# Patient Record
Sex: Male | Born: 1985
Health system: Southern US, Community
[De-identification: ages and names within clinical notes are randomized; demographics above are authoritative.]

## PROBLEM LIST (undated history)

## (undated) DIAGNOSIS — Z952 Presence of prosthetic heart valve: Secondary | ICD-10-CM

## (undated) DIAGNOSIS — I2510 Atherosclerotic heart disease of native coronary artery without angina pectoris: Secondary | ICD-10-CM

## (undated) DIAGNOSIS — I10 Essential (primary) hypertension: Secondary | ICD-10-CM

## (undated) HISTORY — PX: CARDIAC SURGERY: SHX584

## (undated) HISTORY — PX: MITRAL VALVE REPLACEMENT: SHX147

---

## 2014-09-24 DIAGNOSIS — Z952 Presence of prosthetic heart valve: Secondary | ICD-10-CM

## 2014-09-24 HISTORY — DX: Presence of prosthetic heart valve: Z95.2

## 2016-01-29 ENCOUNTER — Encounter (HOSPITAL_COMMUNITY): Payer: Self-pay | Admitting: Emergency Medicine

## 2016-01-29 ENCOUNTER — Emergency Department (HOSPITAL_COMMUNITY)
Admission: EM | Admit: 2016-01-29 | Discharge: 2016-01-29 | Disposition: A | Discharge status: Home / Self Care | Payer: Commercial Managed Care - PPO | Attending: Emergency Medicine | Admitting: Emergency Medicine

## 2016-01-29 ENCOUNTER — Emergency Department (HOSPITAL_COMMUNITY): Payer: Commercial Managed Care - PPO

## 2016-01-29 DIAGNOSIS — I10 Essential (primary) hypertension: Secondary | ICD-10-CM | POA: Diagnosis not present

## 2016-01-29 DIAGNOSIS — Z7901 Long term (current) use of anticoagulants: Secondary | ICD-10-CM | POA: Diagnosis not present

## 2016-01-29 DIAGNOSIS — Z7952 Long term (current) use of systemic steroids: Secondary | ICD-10-CM | POA: Insufficient documentation

## 2016-01-29 DIAGNOSIS — G510 Bell's palsy: Secondary | ICD-10-CM

## 2016-01-29 DIAGNOSIS — R29810 Facial weakness: Secondary | ICD-10-CM | POA: Insufficient documentation

## 2016-01-29 HISTORY — DX: Essential (primary) hypertension: I10

## 2016-01-29 LAB — PROTIME-INR
INR: 1.09 (ref 0.00–1.49)
PROTHROMBIN TIME: 13.9 s (ref 11.6–15.2)

## 2016-01-29 MED ORDER — PREDNISONE 10 MG PO TABS: 60.0000 mg | ORAL_TABLET | Freq: Every day | ORAL | Status: DC

## 2016-01-29 NOTE — ED Provider Notes (Signed)
CSN: [id number]     Arrival date & time 01/29/16  0755 History   First MD Initiated Contact with Patient 01/29/16 0802     Chief Complaint  Patient presents with  . Facial Droop     (Consider location/radiation/quality/duration/timing/severity/associated sxs/prior Treatment) HPI 30 year old male who presents with left-sided facial droop. He has a history of a mitral valve replacement on Coumadin. States that he is not very compliant with his Coumadin. 2 days ago noticed that he had a left-sided facial droop. Thinks it may have been associated with heaviness in his left arm, and sensation that he may have had some difficulty using the arm. Noticed blurry vision with this. Denies any double vision, difficulty swallowing, dysarthria or aphasia, difficulty ambulating. States that over the past 2 days his symptoms had asked gradually resolved. Was told by a family member to come to the ED given that he has not been compliant with his Coumadin. Currently states that his symptoms have significantly improved, and he states he thinks he may feel slight droop. No fever, chills, cough, URI symptoms, cold sores.   Past Medical History  Diagnosis Date  . Hypertension    Past Surgical History  Procedure Laterality Date  . Mitral valve replacement    . Cardiac surgery     History reviewed. No pertinent family history. Social History  Substance Use Topics  . Smoking status: None  . Smokeless tobacco: None  . Alcohol Use: None    Review of Systems 10/14 systems reviewed and are negative other than those stated in the HPI    Allergies  Review of patient's allergies indicates no known allergies.  Home Medications   Prior to Admission medications   Medication Sig Start Date End Date Taking? Authorizing Provider  warfarin (COUMADIN) 4 MG tablet Take 12 mg by mouth daily.   Yes Historical Provider, MD  predniSONE (DELTASONE) 10 MG tablet Take 6 tablets (60 mg total) by mouth daily. 01/29/16   Lavera Guise, MD   BP 135/94 mmHg  Pulse 77  Temp(Src) 98.7 F (37.1 C) (Oral)  Resp 18  Wt 220 lb (99.791 kg)  SpO2 98% Physical Exam Physical Exam  Nursing note and vitals reviewed. Constitutional: Well developed, well nourished, non-toxic, and in no acute distress Head: Normocephalic and atraumatic.  Mouth/Throat: Oropharynx is clear and moist.  Neck: Normal range of motion. Neck supple.  Cardiovascular: Normal rate and regular rhythm.   Pulmonary/Chest: Effort normal and breath sounds normal.  Abdominal: Soft. There is no tenderness. There is no rebound and no guarding.  Musculoskeletal: Normal range of motion.  Skin: Skin is warm and dry.  Psychiatric: Cooperative Neurological:  Alert, oriented to person, place, time, and situation. Memory grossly in tact. Fluent speech. No dysarthria or aphasia.  Cranial nerves: VF are full. Pupils are symmetric, and reactive to light. EOMI without nystagmus. No gaze deviation. Facial muscles symmetric with activation, but at rest with lack of forehead wrinkles on the left compared to right side. Sensation to light touch over face in tact bilaterally. Hearing grossly in tact. Palate elevates symmetrically. Head turn and shoulder shrug are intact. Tongue midline.  Reflexes defered.  Muscle bulk and tone normal. No pronator drift. Moves all extremities symmetrically. Sensation to light touch is in tact throughout in bilateral upper and lower extremities. Coordination reveals no dysmetria with finger to nose. Gait is narrow-based and steady. Non-ataxic.   ED Course  Procedures (including critical care time) Labs Review Labs Reviewed  [facility]  Imaging Review Mr Sherrin DaisyBrain Wo Contrast  01/29/2016  CLINICAL DATA:  30 year old hypertensive male non compliant with Coumadin (post mitral valve replacement) presenting with history of left-sided facial droop and questionable left arm heaviness 2 days ago which has resolved. Possible Bell's palsy.  Initial encounter. EXAM: MRI HEAD WITHOUT CONTRAST TECHNIQUE: Multiplanar, multiecho pulse sequences of the brain and surrounding structures were obtained without intravenous contrast. COMPARISON:  None. FINDINGS: No acute infarct or intracranial hemorrhage. No intracranial mass lesion noted on this unenhanced exam. No hydrocephalus. Right vertebral artery poorly delineated. Ectatic left vertebral artery and basilar artery. Limited ability to evaluate for Bell's palsy without dedicated contrast enhanced thin-section imaging through the internal auditory canal region. No gross abnormality detected. Partial opacification ethmoid sinus air cells with minimal mucosal thickening maxillary sinuses. Slight decreased signal intensity of bone marrow of portions of the clivus without discrete mass noted. Cervical medullary junction and pineal region unremarkable. IMPRESSION: No acute infarct or intracranial hemorrhage. No intracranial mass lesion noted on this unenhanced exam. Right vertebral artery poorly delineated. Limited ability to evaluate for Bell's palsy without dedicated contrast enhanced thin-section imaging through the internal auditory canal region. No gross abnormality detected. Partial opacification ethmoid sinus air cells with minimal mucosal thickening maxillary sinuses. Electronically Signed   By: Lacy DuverneySteven  Olson M.D.   On: 01/29/2016 11:07   I have personally reviewed and evaluated these images and lab results as part of my medical decision-making.   EKG Interpretation None      MDM   Final diagnoses:  Bell's palsy    30 year old male with history of mitral valve replacement and noncompliance with Coumadin who presents with left-sided facial droop and questionable left arm heaviness 2 days ago, which is now resolved. On presentation he is nontoxic and in no acute distress. Mildly hypertensive with blood pressure 140s over 100. Has normal activation of facial muscles, but I do notice a subtle  lack of wrinkles over the left forehead compared to the right. My suspicion is that he may have had a Bell's palsy, but question of left arm heaviness which is now resolved would argue against that. Also risk for embolic stroke given his noncompliance with Coumadin. We'll obtain MRI and INR. If MRI is negative likely Bell's palsy.  MRI visualized. Reviewed with radiology. No evidence of acute infarct or hemorrhage. Given a course of steroids for Bell's pulse and discussed eye care the patient has full eye closing now and only subtle findings of lack of wrinkles over the left forehead. Strict return and follow-up instructions are reviewed. Discussed the importance of compliance with Coumadin. He is breast understanding of all discharge instructions, and felt comfortable with the plan of care.   Lavera Guiseana Duo Maud Rubendall, MD 01/29/16 [phone]

## 2016-01-29 NOTE — ED Notes (Signed)
MD at bedside. 

## 2016-01-29 NOTE — ED Notes (Addendum)
Hx of mitral valve replacement last year. Has not been taking his blood thinner regularly over the last couple months. Noticed right sided facial droop on Friday, worse Saturday. States it's gotten better since. No other neuro deficits observed in triage.

## 2016-01-29 NOTE — Discharge Instructions (Signed)
Your MRI is negative for stroke. You likely have Bell's palsy. Take steroids as prescribed. This gets better slowly over several days to weeks. Return for worsening symptoms including slurred speech or word finding difficulties, numbness or weakness of the arms or legs, difficulty walking, vision changes, or any other symptoms concerning to you.  Bell Palsy Bell palsy is a condition in which the muscles on one side of the face become paralyzed. This often causes one side of the face to droop. It is a common condition and most people recover completely. RISK FACTORS Risk factors for Bell palsy include:  Pregnancy.  Diabetes.  An infection by a virus, such as infections that cause cold sores. CAUSES  Bell palsy is caused by damage to or inflammation of a nerve in your face. It is unclear why this happens, but an infection by a virus may lead to it. Most of the time the reason it happens is unknown. SIGNS AND SYMPTOMS  Symptoms can range from mild to severe and can take place over a number of hours. Symptoms may include:  Being unable to:  Raise one or both eyebrows.  Close one or both eyes.  Feel parts of your face (facial numbness).  Drooping of the eyelid and corner of the mouth.  Weakness in the face.  Paralysis of half your face.  Loss of taste.  Sensitivity to loud noises.  Difficulty chewing.  Tearing up of the affected eye.  Dryness in the affected eye.  Drooling.  Pain behind one ear. DIAGNOSIS  Diagnosis of Bell palsy may include:  A medical history and physical exam.  An MRI.  A CT scan.  Electromyography (EMG). This is a test that checks how your nerves are working. TREATMENT  Treatment may include antiviral medicine to help shorten the length of the condition. Sometimes treatment is not needed and the symptoms go away on their own. HOME CARE INSTRUCTIONS   Take medicines only as directed by your health care provider.  Do facial massages and  exercises as directed by your health care provider.  If your eye is affected:  Use moisturizing eye drops to prevent drying of your eye as directed by your health care provider.  Protect your eye as directed by your health care provider. SEEK MEDICAL CARE IF:  Your symptoms do not get better or get worse.  You are drooling.  Your eye is red, irritated, or hurts. SEEK IMMEDIATE MEDICAL CARE IF:   Another part of your body feels weak or numb.  You have difficulty swallowing.  You have a fever along with symptoms of Bell palsy.  You develop neck pain. MAKE SURE YOU:   Understand these instructions.  Will watch your condition.  Will get help right away if you are not doing well or get worse.   This information is not intended to replace advice given to you by your health care provider. Make sure you discuss any questions you have with your health care provider.   Document Released: 09/10/2005 Document Revised: 06/01/2015 Document Reviewed: 12/18/2013 Elsevier Interactive Patient Education [employer]2016 Elsevier Inc.

## 2016-01-29 NOTE — ED Notes (Signed)
Discharge instructions, follow up care, and rx x1 reviewed with patient. Patient verbalized understanding. 

## 2018-04-25 MED FILL — WARFARIN SODIUM 4 MG TABLET: 4 | 30 days supply | Qty: 90 | Fill #0

## 2018-08-05 ENCOUNTER — Other Ambulatory Visit: Payer: Self-pay

## 2018-08-05 ENCOUNTER — Emergency Department (HOSPITAL_COMMUNITY): Payer: 59

## 2018-08-05 ENCOUNTER — Inpatient Hospital Stay (HOSPITAL_COMMUNITY)
Admission: EM | Admit: 2018-08-05 | Discharge: 2018-08-07 | DRG: 251 | Disposition: A | Discharge status: Home / Self Care | Payer: 59 | Attending: Cardiology | Admitting: Cardiology

## 2018-08-05 ENCOUNTER — Encounter (HOSPITAL_COMMUNITY): Payer: Self-pay

## 2018-08-05 DIAGNOSIS — I4510 Unspecified right bundle-branch block: Secondary | ICD-10-CM | POA: Diagnosis present

## 2018-08-05 DIAGNOSIS — I25110 Atherosclerotic heart disease of native coronary artery with unstable angina pectoris: Secondary | ICD-10-CM | POA: Diagnosis present

## 2018-08-05 DIAGNOSIS — R7989 Other specified abnormal findings of blood chemistry: Secondary | ICD-10-CM | POA: Diagnosis not present

## 2018-08-05 DIAGNOSIS — Z7901 Long term (current) use of anticoagulants: Secondary | ICD-10-CM

## 2018-08-05 DIAGNOSIS — I10 Essential (primary) hypertension: Secondary | ICD-10-CM | POA: Diagnosis not present

## 2018-08-05 DIAGNOSIS — Z833 Family history of diabetes mellitus: Secondary | ICD-10-CM | POA: Diagnosis not present

## 2018-08-05 DIAGNOSIS — Z9119 Patient's noncompliance with other medical treatment and regimen: Secondary | ICD-10-CM

## 2018-08-05 DIAGNOSIS — Z952 Presence of prosthetic heart valve: Secondary | ICD-10-CM

## 2018-08-05 DIAGNOSIS — I200 Unstable angina: Secondary | ICD-10-CM | POA: Diagnosis present

## 2018-08-05 DIAGNOSIS — I214 Non-ST elevation (NSTEMI) myocardial infarction: Principal | ICD-10-CM

## 2018-08-05 DIAGNOSIS — I2510 Atherosclerotic heart disease of native coronary artery without angina pectoris: Secondary | ICD-10-CM | POA: Diagnosis not present

## 2018-08-05 DIAGNOSIS — Z955 Presence of coronary angioplasty implant and graft: Secondary | ICD-10-CM

## 2018-08-05 DIAGNOSIS — R079 Chest pain, unspecified: Secondary | ICD-10-CM | POA: Diagnosis not present

## 2018-08-05 HISTORY — DX: Atherosclerotic heart disease of native coronary artery without angina pectoris: I25.10

## 2018-08-05 HISTORY — DX: Presence of prosthetic heart valve: Z95.2

## 2018-08-05 LAB — HEPARIN LEVEL (UNFRACTIONATED): Heparin Unfractionated: 0.16 IU/mL — ABNORMAL LOW (ref 0.30–0.70)

## 2018-08-05 LAB — BASIC METABOLIC PANEL
Anion gap: 7 (ref 5–15)
BUN: 11 mg/dL (ref 6–20)
CO2: 28 mmol/L (ref 22–32)
CREATININE: 1.04 mg/dL (ref 0.61–1.24)
Calcium: 9.2 mg/dL (ref 8.9–10.3)
Chloride: 104 mmol/L (ref 98–111)
GFR calc non Af Amer: >60 mL/min (ref >60)
Glucose, Bld: 97 mg/dL (ref 70–99)
Potassium: 4.1 mmol/L (ref 3.5–5.1)
Sodium: 139 mmol/L (ref 135–145)

## 2018-08-05 LAB — TROPONIN I
TROPONIN I: 13.49 ng/mL — AB (ref <0.03)
TROPONIN I: 15.91 ng/mL — AB (ref <0.03)
Troponin I: 0.09 ng/mL (ref <0.03)

## 2018-08-05 LAB — CBC WITH DIFFERENTIAL/PLATELET
Abs Immature Granulocytes: 0.02 10*3/uL (ref 0.00–0.07)
BASOS ABS: 0 10*3/uL (ref 0.0–0.1)
Basophils Relative: 0 %
EOS PCT: 0 %
Eosinophils Absolute: 0 10*3/uL (ref 0.0–0.5)
HCT: 46 % (ref 39.0–52.0)
Hemoglobin: 15.5 g/dL (ref 13.0–17.0)
IMMATURE GRANULOCYTES: 0 %
LYMPHS ABS: 1.4 10*3/uL (ref 0.7–4.0)
LYMPHS PCT: 32 %
MCH: 29.3 pg (ref 26.0–34.0)
MCHC: 33.7 g/dL (ref 30.0–36.0)
MCV: 87 fL (ref 80.0–100.0)
Monocytes Absolute: 0.5 10*3/uL (ref 0.1–1.0)
Monocytes Relative: 10 %
NEUTROS ABS: 2.6 10*3/uL (ref 1.7–7.7)
NEUTROS PCT: 58 %
NRBC: 0 % (ref 0.0–0.2)
PLATELETS: 278 10*3/uL (ref 150–400)
RBC: 5.29 MIL/uL (ref 4.22–5.81)
RDW: 12.8 % (ref 11.5–15.5)
WBC: 4.6 10*3/uL (ref 4.0–10.5)

## 2018-08-05 LAB — PROTIME-INR
INR: 1.09
Prothrombin Time: 14 seconds (ref 11.4–15.2)

## 2018-08-05 LAB — I-STAT TROPONIN, ED: TROPONIN I, POC: 0.07 ng/mL (ref 0.00–0.08)

## 2018-08-05 LAB — APTT: aPTT: 33 seconds (ref 24–36)

## 2018-08-05 MED ORDER — WARFARIN - PHARMACIST DOSING INPATIENT: Freq: Every day | Status: DC

## 2018-08-05 MED ORDER — WARFARIN SODIUM 10 MG PO TABS
10.0000 mg | ORAL_TABLET | Freq: Once | ORAL | Status: DC
  Filled 2018-08-05 (×2): qty 1

## 2018-08-05 MED ORDER — METOPROLOL TARTRATE 25 MG PO TABS
25.0000 mg | ORAL_TABLET | Freq: Two times a day (BID) | ORAL | Status: DC
  Administered 2018-08-05 – 2018-08-07 (×4): 25 mg via ORAL
  Filled 2018-08-05 (×4): qty 1

## 2018-08-05 MED ORDER — HEPARIN (PORCINE) 25000 UT/250ML-% IV SOLN
1600.0000 [IU]/h | INTRAVENOUS | Status: DC
  Administered 2018-08-05: 1300 [IU]/h via INTRAVENOUS
  Administered 2018-08-06: 1600 [IU]/h via INTRAVENOUS
  Filled 2018-08-05 (×2): qty 250

## 2018-08-05 MED ORDER — ASPIRIN EC 81 MG PO TBEC
81.0000 mg | DELAYED_RELEASE_TABLET | Freq: Every day | ORAL | Status: DC
  Administered 2018-08-07: 08:00:00 81 mg via ORAL
  Filled 2018-08-05: qty 1

## 2018-08-05 MED ORDER — ACETAMINOPHEN 325 MG PO TABS: 650.0000 mg | ORAL_TABLET | ORAL | Status: DC | PRN

## 2018-08-05 MED ORDER — ONDANSETRON HCL 4 MG/2ML IJ SOLN: 4.0000 mg | Freq: Four times a day (QID) | INTRAMUSCULAR | Status: DC | PRN

## 2018-08-05 MED ORDER — KETOROLAC TROMETHAMINE 30 MG/ML IJ SOLN
30.0000 mg | Freq: Once | INTRAMUSCULAR | Status: AC
  Administered 2018-08-05: 30 mg via INTRAVENOUS
  Filled 2018-08-05: qty 1

## 2018-08-05 MED ORDER — ATORVASTATIN CALCIUM 40 MG PO TABS
40.0000 mg | ORAL_TABLET | Freq: Every day | ORAL | Status: DC
  Administered 2018-08-05 – 2018-08-06 (×2): 40 mg via ORAL
  Filled 2018-08-05 (×2): qty 1

## 2018-08-05 MED ORDER — NITROGLYCERIN 0.4 MG SL SUBL: 0.4000 mg | SUBLINGUAL_TABLET | SUBLINGUAL | Status: DC | PRN

## 2018-08-05 MED ORDER — ASPIRIN 81 MG PO CHEW
324.0000 mg | CHEWABLE_TABLET | Freq: Once | ORAL | Status: AC
  Administered 2018-08-05: 324 mg via ORAL
  Filled 2018-08-05: qty 4

## 2018-08-05 MED ORDER — HEPARIN BOLUS VIA INFUSION
4000.0000 [IU] | Freq: Once | INTRAVENOUS | Status: AC
  Administered 2018-08-05: 4000 [IU] via INTRAVENOUS
  Filled 2018-08-05: qty 4000

## 2018-08-05 MED ORDER — METOPROLOL TARTRATE 25 MG PO TABS
50.0000 mg | ORAL_TABLET | Freq: Once | ORAL | Status: AC
  Administered 2018-08-05: 50 mg via ORAL
  Filled 2018-08-05: qty 2

## 2018-08-05 NOTE — Progress Notes (Signed)
The second troponin increased from 0.07 -> 13.49, we will cancel coronary CTA and schedule a cardiac catheterization in the am, we will ask an interventional cardiologist to perform fluoroscopy to assess mechanical mitral valve mobility. Discontinue coumadin, continue heparin drip.  Tobias AlexanderKatarina Haruna Rohlfs, MD 08/05/2018

## 2018-08-05 NOTE — H&P (Signed)
Cardiology Admission History and Physical:   Patient ID: Benjamin Rodriguez MRN: [id number]; DOB: 1986-05-13   Admission date: 08/05/2018  Primary Care Provider: Patient, No Pcp Per Primary Cardiologist: in [facility], last seen in 2017  Chief Complaint: Chest pain  Patient Profile:   Benjamin Rodriguez is a 32 y.o. male with mitral valve replacement in 2016 presenting with chest pain for the last few weeks.  History of Present Illness:   Benjamin Rodriguez is a very pleasant 32 year old male with past medical history of severe mitral regurgitation status post replacement with a mechanical Saint Jude valve at [facility] on November 12, 2014.  Per patient prior to the surgery he had symptoms of dyspnea that completely resolved post surgery.  He has been started on Coumadin and to follow for limited time with a cardiologist at [facility].  He has moved to [place] 2 years ago and states that for about the last year he has not been able to get prescription for Coumadin as he did not have PCP nor cardiologist.  Despite that he has been doing well he maintain a healthy weight and exercise daily he did not noticed any lower extremity edema orthopnea proximal nocturnal dyspnea, no dyspnea on exertion while working out.  The patient has noticed that for the last few weeks he has been having intermittent retrosternal chest pains that are not related to exertion, he is still able to exercise in the mornings, his pain feels like a squeeze, they can be sharp, they are worse with deep inspiration no necessarily worse in horizontal position.  He came to the ER as his chest pain would not go away overnight.  It has completely resolved with ketorolac.  His family history is significant for diabetes but otherwise no CHF or premature coronary artery disease.  He drinks occasionally does not smoke and does not use any drugs.  He currently does not use any medications.   Past Medical History:  Diagnosis Date  . Hypertension     Past Surgical  History:  Procedure Laterality Date  . CARDIAC SURGERY    . MITRAL VALVE REPLACEMENT       Medications Prior to Admission: Prior to Admission medications   Medication Sig Start Date End Date Taking? Authorizing Provider  warfarin (COUMADIN) 4 MG tablet Take 12 mg by mouth daily.   Yes [provider]  predniSONE (DELTASONE) 10 MG tablet Take 6 tablets (60 mg total) by mouth daily. Patient not taking: Reported on 08/05/2018 01/29/16   Lavera Guise, MD  warfarin (COUMADIN) 4 MG tablet Take 12 mg by mouth daily.    [provider]     Allergies:   No Known Allergies  Social History:   Social History   Socioeconomic History  . Marital status: Single    Spouse name: Not on file  . Number of children: Not on file  . Years of education: Not on file  . Highest education level: Not on file  Occupational History  . Not on file  Social Needs  . Financial resource strain: Not on file  . Food insecurity:    Worry: Not on file    Inability: Not on file  . Transportation needs:    Medical: Not on file    Non-medical: Not on file  Tobacco Use  . Smoking status: Never Smoker  . Smokeless tobacco: Never Used  Substance and Sexual Activity  . Alcohol use: Not on file  . Drug use: Not on file  . Sexual  activity: Not on file  Lifestyle  . Physical activity:    Days per week: Not on file    Minutes per session: Not on file  . Stress: Not on file  Relationships  . Social connections:    Talks on phone: Not on file    Gets together: Not on file    Attends religious service: Not on file    Active member of club or organization: Not on file    Attends meetings of clubs or organizations: Not on file    Relationship status: Not on file  . Intimate partner violence:    Fear of current or ex partner: Not on file    Emotionally abused: Not on file    Physically abused: Not on file    Forced sexual activity: Not on file  Other Topics Concern  . Not on file  Social  History Narrative  . Not on file    Family History: Diabetes mellitus in his mother.  ROS:  Please see the history of present illness.  All other ROS reviewed and negative.     Physical Exam/Data:   Vitals:   08/05/18 0936 08/05/18 1000 08/05/18 1030 08/05/18 1100  BP: 139/84 (!) 141/93 (!) 147/97 (!) 135/101  Pulse: 78 81 79 82  Resp: 16 14 17 16   Temp:      TempSrc:      SpO2: 99% 97% 93% 97%  Weight:      Height:       No intake or output data in the 24 hours ending 08/05/18 1116 Filed Weights   08/05/18 0752  Weight: 96.2 kg   Body mass index is 30.42 kg/m.  General:  Well nourished, well developed, in no acute distress HEENT: normal Lymph: no adenopathy Neck: no JVD Endocrine:  No thryomegaly Vascular: No carotid bruits; FA pulses 2+ bilaterally without bruits  Cardiac: Prominent S1 and S2 click and only mild systolic 2 out of 6 murmur. Lungs:  clear to auscultation bilaterally, no wheezing, rhonchi or rales  Abd: soft, nontender, no hepatomegaly  Ext: no edema Musculoskeletal:  No deformities, BUE and BLE strength normal and equal Skin: warm and dry  Neuro:  CNs 2-12 intact, no focal abnormalities noted Psych:  Normal affect   EKG:  The ECG that was done  was personally reviewed and demonstrates normal sinus rhythm with early repolarization in inferolateral leads.  No prior EKGs available for comparison.  Relevant CV Studies: Echocardiogram is ordered  Laboratory Data:  Chemistry Recent Labs  Lab 08/05/18 0833  NA 139  K 4.1  CL 104  CO2 28  GLUCOSE 97  BUN 11  CREATININE 1.04  CALCIUM 9.2  GFRNONAA >60  GFRAA >60  ANIONGAP 7    No results for input(s): PROT, ALBUMIN, AST, ALT, ALKPHOS, BILITOT in the last 168 hours. Hematology Recent Labs  Lab 08/05/18 0833  WBC 4.6  RBC 5.29  HGB 15.5  HCT 46.0  MCV 87.0  MCH 29.3  MCHC 33.7  RDW 12.8  PLT 278   Cardiac Enzymes Recent Labs  Lab 08/05/18 0833  TROPONINI 0.09*    Recent  Labs  Lab 08/05/18 0839  TROPIPOC 0.07    BNPNo results for input(s): BNP, PROBNP in the last 168 hours.  DDimer No results for input(s): DDIMER in the last 168 hours.  Radiology/Studies:  Dg Chest 2 View  Result Date: 08/05/2018 CLINICAL DATA:  Chest pain EXAM: CHEST - 2 VIEW COMPARISON:  None. FINDINGS: Prior mitral  valve repair. Mild cardiomegaly. Lungs clear. No effusions or acute bony abnormality. IMPRESSION: Mild cardiomegaly.  No active disease. Electronically Signed   By: Charlett Nose M.D.   On: 08/05/2018 08:48    Assessment and Plan:   1. Chest pain  -With minimal troponin elevation, EKG with only early repolarization, given his age and noncompliance with Coumadin the most worrisome scenario would be mitral valve clotting with embolization to coronary artery, however his symptoms more pleuritic suggestive of acute pericarditis also his symptoms have been going on for few weeks that goes against acute coronary syndrome.  This is most probably acute myopericarditis. -We will start heparin drip until we rule out acute coronary syndrome.  2.  Status post mitral valve replacement with a 31 mm St Jude mechanical valve, the patient has not been on anticoagulation for last year, his INR is 1, will start heparin drip and ask pharmacy to dose Coumadin. We will establish care with our clinic for this patient.  We will obtain an echocardiogram to evaluate for LVEF, to see motion of the mitral valve leaflets and transmitral gradients, the patient does not show any signs of fluid overload on physical exam.  I will also obtain coronary CTA to see if there is any thrombus present on mitral valve leaflets.  We will transfer the patient to [facility] telemetry.  Severity of Illness: The appropriate patient status for this patient is INPATIENT. Inpatient status is judged to be reasonable and necessary in order to provide the required intensity of service to ensure the patient's safety. The  patient's presenting symptoms, physical exam findings, and initial radiographic and laboratory data in the context of their chronic comorbidities is felt to place them at high risk for further clinical deterioration. Furthermore, it is not anticipated that the patient will be medically stable for discharge from the hospital within 2 midnights of admission. The following factors support the patient status of inpatient.   " The patient's presenting symptoms include chest pain. " The worrisome physical exam findings include systolic murmur. " The initial radiographic and laboratory data are worrisome because of elevated troponin, low INR in the settings of mechanical mitral valve. " The chronic co-morbidities include status post mitral valve replacement, noncompliance.   * I certify that at the point of admission it is my clinical judgment that the patient will require inpatient hospital care spanning beyond 2 midnights from the point of admission due to high intensity of service, high risk for further deterioration and high frequency of surveillance required.*    For questions or updates, please contact [facility] Please consult [email or web address] for contact info under        Signed, Tobias Alexander, MD  08/05/2018 11:16 AM

## 2018-08-05 NOTE — ED Triage Notes (Signed)
Pt reports chest tightness x 2 hours ago. Denies pain or radiating. Prior Mitral valve replacement 2016

## 2018-08-05 NOTE — Progress Notes (Signed)
ANTICOAGULATION CONSULT NOTE - Initial Consult  Pharmacy Consult for Heparin/Coumadin Indication: chest pain/ACS/MVR  No Known Allergies  Patient Measurements: Height: 5\' 10"  (177.8 cm) Weight: 212 lb (96.2 kg) IBW/kg (Calculated) : 73 Heparin Dosing Weight: 93 kg  Vital Signs: Temp: 97.9 F (36.6 C) (11/12 0749) Temp Source: Oral (11/12 0749) BP: 135/101 (11/12 1100) Pulse Rate: 82 (11/12 1100)  Labs: Recent Labs    08/05/18 0833  HGB 15.5  HCT 46.0  PLT 278  CREATININE 1.04  TROPONINI 0.09*    Estimated Creatinine Clearance: 117.6 mL/min (by C-G formula based on SCr of 1.04 mg/dL).   Medical History: Past Medical History:  Diagnosis Date  . Hypertension     Medications:  Off Coumadin for ~ 1 year due to no PCP, cardiologist  Assessment: 32 y/o M with MVR with Saint Jude mechanical valve in 2016 who has been off Coumadin due to lack of PCP, cardiologist [place]. Per cardiology, symptoms are more consistent with pericarditis. Heparin drip initiated until r/o ACS.   Goal of Therapy:  INR 2.5-3.5 Heparin level 0.3-0.7 units/ml Monitor platelets by anticoagulation protocol: Yes   Plan:  Give 4000 units bolus x 1 Start heparin infusion at 1300 units/hr Coumadin 10 mg x 1 Check anti-Xa level in 6 hours and daily while on heparin Daily INR Continue to monitor H&H and platelets  Luisa Harthristy, Shadow Stiggers D 08/05/2018,11:36 AM

## 2018-08-05 NOTE — ED Provider Notes (Signed)
[facility]-EMERGENCY DEPT Provider Note   CSN: [id number] Arrival date & time: 08/05/18  4098     History   Chief Complaint Chief Complaint  Patient presents with  . Chest Pain    HPI Benjamin Rodriguez is a 32 y.o. male.  Patient is a 32 year old male who presents with chest pain.  He states that he has had some intermittent discomfort to the center of his chest over the last 2 weeks.  Describes as a tightness.  It usually only lasts about 10 to 20 minutes but he had an episode this morning that lasted about an hour.  He states he only has some mild discomfort currently.  He felt a little short of breath this morning but no other shortness of breath.  The pain is not exertional.  He has no associated nausea or vomiting.  No fevers.  He did recently get over a URI symptoms and has had a residual cough since then.  No history of coronary artery disease that is known.  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  He was previously on Coumadin but when he moved from [facility] to [place], he has not been able to establish care with a new PCP.  Therefore he has been off his Coumadin for about 2 months.  No leg pain or swelling.     Past Medical History:  Diagnosis Date  . Hypertension     Patient Active Problem List   Diagnosis Date Noted  . Unstable angina (HCC) 08/05/2018    Past Surgical History:  Procedure Laterality Date  . CARDIAC SURGERY    . MITRAL VALVE REPLACEMENT          Home Medications    Prior to Admission medications   Medication Sig Start Date End Date Taking? Authorizing Provider  warfarin (COUMADIN) 4 MG tablet Take 12 mg by mouth daily.   Yes [provider]  predniSONE (DELTASONE) 10 MG tablet Take 6 tablets (60 mg total) by mouth daily. Patient not taking: Reported on 08/05/2018 01/29/16   Lavera Guise, MD  warfarin (COUMADIN) 4 MG tablet Take 12 mg by mouth daily.    [provider]    Family History History reviewed. No pertinent family history.  Social History Social History   Tobacco Use  . Smoking status: Never Smoker  . Smokeless tobacco: Never Used  Substance Use Topics  . Alcohol use: Not on file  . Drug use: Not on file     Allergies   Patient has no known allergies.   Review of Systems Review of Systems  Constitutional: Negative for chills, diaphoresis, fatigue and fever.  HENT: Negative for congestion, rhinorrhea and sneezing.   Eyes: Negative.   Respiratory: Positive for shortness of breath. Negative for cough and chest tightness.   Cardiovascular: Positive for chest pain. Negative for leg swelling.  Gastrointestinal: Negative for abdominal pain, blood in stool, diarrhea, nausea and vomiting.  Genitourinary: Negative for difficulty urinating, flank pain, frequency and hematuria.  Musculoskeletal: Negative for arthralgias and back pain.  Skin: Negative for rash.  Neurological: Negative for dizziness, speech difficulty, weakness, numbness and headaches.     Physical Exam Updated Vital Signs BP 138/87   Pulse 77   Temp 97.9 F (36.6 C) (Oral)   Resp 14   Ht 5\' 10"  (1.778 m)   Wt 96.2 kg   SpO2 93%   BMI 30.42 kg/m   Physical  Exam  Constitutional: He is oriented to person, place, and time. He appears well-developed and well-nourished.  HENT:  Head: Normocephalic and atraumatic.  Eyes: Pupils are equal, round, and reactive to light.  Neck: Normal range of motion. Neck supple.  Cardiovascular: Normal rate, regular rhythm and normal heart sounds.  Pulmonary/Chest: Effort normal and breath sounds normal. No respiratory distress. He has no wheezes. He has no rales. He exhibits tenderness (Some reproducible tenderness over the sternum.).  Abdominal: Soft. Bowel sounds are normal. There is no tenderness. There is no rebound and no guarding.  Musculoskeletal: Normal range of motion. He exhibits no edema.  Lymphadenopathy:     He has no cervical adenopathy.  Neurological: He is alert and oriented to person, place, and time.  Skin: Skin is warm and dry. No rash noted.  Psychiatric: He has a normal mood and affect.     ED Treatments / Results  Labs (all labs ordered are listed, but only abnormal results are displayed) Labs Reviewed  TROPONIN I - Abnormal; Notable for the following components:      Result Value   Troponin I 0.09 (*)    All other components within normal limits  BASIC METABOLIC PANEL  CBC WITH DIFFERENTIAL/PLATELET  PROTIME-INR  APTT  HEPARIN LEVEL (UNFRACTIONATED)  I-STAT TROPONIN, ED    EKG EKG Interpretation  Date/Time:  Tuesday August 05 2018 10:03:36 EST Ventricular Rate:  81 PR Interval:    QRS Duration: 85 QT Interval:  377 QTC Calculation: 438 R Axis:   77 Text Interpretation:  Sinus rhythm Left atrial enlargement Inferolateral infarct, acute Borderline ST elevation, anterior leads Confirmed by Rolan Bucco [phone]) on 08/05/2018 10:15:25 AM   Radiology Dg Chest 2 View  Result Date: 08/05/2018 CLINICAL DATA:  Chest pain EXAM: CHEST - 2 VIEW COMPARISON:  None. FINDINGS: Prior mitral valve repair. Mild cardiomegaly. Lungs clear. No effusions or acute bony abnormality. IMPRESSION: Mild cardiomegaly.  No active disease. Electronically Signed   By: Charlett Nose M.D.   On: 08/05/2018 08:48    Procedures Procedures (including critical care time)  Medications Ordered in ED Medications  heparin ADULT infusion 100 units/mL (25000 units/271mL sodium chloride 0.45%) (1,300 Units/hr Intravenous New Bag/Given 08/05/18 1202)  metoprolol tartrate (LOPRESSOR) tablet 25 mg (has no administration in time range)  warfarin (COUMADIN) tablet 10 mg (has no administration in time range)  Warfarin - Pharmacist Dosing Inpatient (has no administration in time range)  ketorolac (TORADOL) 30 MG/ML injection 30 mg (30 mg Intravenous Given 08/05/18 0834)  aspirin chewable tablet 324 mg  (324 mg Oral Given 08/05/18 0957)  metoprolol tartrate (LOPRESSOR) tablet 50 mg (50 mg Oral Given 08/05/18 1203)  heparin bolus via infusion 4,000 Units (4,000 Units Intravenous Bolus from Bag 08/05/18 1202)     Initial Impression / Assessment and Plan / ED Course  I have reviewed the triage vital signs and the nursing notes.  Pertinent labs & imaging results that were available during my care of the patient were reviewed by me and considered in my medical decision making (see chart for details).     10:00 pt has mildly elevated troponin.  Has mild ST elevation on EKG, but diffuse, in more of a pericarditis pattern.  No reciprocal changes are noted.  Repeat EKG appears similar to her first EKG.  I have a page out to cardiology to discuss findings.  Patient is currently pain-free after Toradol.  He was given aspirin.  Pt seen by Dr. Delton See with cardiology who  will admit the pt for further evaluation.  Final Clinical Impressions(s) / ED Diagnoses   Final diagnoses:  Chest pain  Chest pain  Chest pain    ED Discharge Orders    None       Rolan BuccoBelfi, Joua Bake, MD 08/05/18 1237

## 2018-08-05 NOTE — Progress Notes (Signed)
I reviewed the cardiac cath with pt and need for having and that he had a heart attack.  All questions answered  The patient understands that risks included but are not limited to stroke (1 in 1000), death (1 in 1000), kidney failure [usually temporary] (1 in 500), bleeding (1 in 200), allergic reaction [possibly serious] (1 in 200).   Pt's parents will come in the morning as well.

## 2018-08-05 NOTE — H&P (View-Only) (Signed)
The second troponin increased from 0.07 -> 13.49, we will cancel coronary CTA and schedule a cardiac catheterization in the am, we will ask an interventional cardiologist to perform fluoroscopy to assess mechanical mitral valve mobility. Discontinue coumadin, continue heparin drip.  Calyb Mcquarrie, MD 08/05/2018  

## 2018-08-05 NOTE — Progress Notes (Signed)
ANTICOAGULATION CONSULT NOTE - Initial Consult  Pharmacy Consult for Heparin/Coumadin Indication: chest pain/ACS/MVR  No Known Allergies  Patient Measurements: Height: 5\' 10"  (177.8 cm) Weight: 211 lb 1.6 oz (95.8 kg) IBW/kg (Calculated) : 73 Heparin Dosing Weight: 93 kg  Vital Signs: Temp: 98 F (36.7 C) (11/12 1703) Temp Source: Oral (11/12 1703) BP: 140/92 (11/12 1703) Pulse Rate: 70 (11/12 1703)  Labs: Recent Labs    08/05/18 0833 08/05/18 1201 08/05/18 1707 08/05/18 1907  HGB 15.5  --   --   --   HCT 46.0  --   --   --   PLT 278  --   --   --   APTT  --  33  --   --   LABPROT  --  14.0  --   --   INR  --  1.09  --   --   HEPARINUNFRC  --   --   --  0.16*  CREATININE 1.04  --   --   --   TROPONINI 0.09*  --  13.49*  --     Estimated Creatinine Clearance: 117.3 mL/min (by C-G formula based on SCr of 1.04 mg/dL).   Medical History: Past Medical History:  Diagnosis Date  . Hypertension     Medications:  Off Coumadin for ~ 1 year due to no PCP, cardiologist  Assessment: 32 y/o M with MVR with [employer] mechanical valve in 2016 who has been off Coumadin due to lack of PCP, cardiologist after move [place].  INR 1.09. Heparin level came back subtherapeutic at 0.16, on 1300 units/hr. Hgb 15.5, plt 278. Trop increased to 13.49. No s/sx of bleeding. No infusion issues.    Goal of Therapy:  INR 2.5-3.5 Heparin level 0.3-0.7 units/ml Monitor platelets by anticoagulation protocol: Yes   Plan:  Hold warfarin for cath  Increase heparin infusion to 1600 units/hr Check anti-Xa level in 6 hours and daily while on heparin Daily INR Continue to monitor H&H and platelets F/u plan for cardiac cath tomorrow  Girard CooterKimberly Perkins, PharmD Clinical Pharmacist  Pager: [phone] Phone: [phone] 08/05/2018,8:46 PM

## 2018-08-05 NOTE — Progress Notes (Signed)
CRITICAL VALUE ALERT  Critical Value:  Trop 13.49  Date & Time Notied:  08/05/18 1855  Provider Notified: Nada BoozerLaura Ingold NP    Orders Received/Actions taken: Orders to follow

## 2018-08-05 NOTE — ED Notes (Signed)
ED TO INPATIENT HANDOFF REPORT  Name/Age/Gender [facility] 32 y.o. male  Code Status   Home/SNF/Other Home  Chief Complaint chest tightness   Level of Care/Admitting Diagnosis ED Disposition    ED Disposition Condition [facility]: [place] [100100]  Level of Care: Telemetry [5]  Diagnosis: Unstable angina [facility]) [[id number]]  Admitting Physician: Dorothy Spark [[id number]]  Attending Physician: Dorothy Spark [[id number]]  Estimated length of stay: past midnight tomorrow  Certification:: I certify this patient will need inpatient services for at least 2 midnights  PT Class (Do Not Modify): Inpatient [101]  PT Acc Code (Do Not Modify): Private [1]       Medical History Past Medical History:  Diagnosis Date  . Hypertension     Allergies No Known Allergies  IV Location/Drains/Wounds Patient Lines/Drains/Airways Status   Active Line/Drains/Airways    Name:   Placement date:   Placement time:   Site:   Days:   Peripheral IV 08/05/18 Right Antecubital   08/05/18    0834    Antecubital   less than 1          Labs/Imaging Results for orders placed or performed during the hospital encounter of 08/05/18 (from the past 48 hour(s))  Basic metabolic panel     Status: None   Collection Time: 08/05/18  8:33 AM  Result Value Ref Range   Sodium 139 135 - 145 mmol/L   Potassium 4.1 3.5 - 5.1 mmol/L   Chloride 104 98 - 111 mmol/L   CO2 28 22 - 32 mmol/L   Glucose, Bld 97 70 - 99 mg/dL   BUN 11 6 - 20 mg/dL   Creatinine, Ser 1.04 0.61 - 1.24 mg/dL   Calcium 9.2 8.9 - 10.3 mg/dL   GFR calc non Af Amer >60 >60 mL/min   GFR calc Af Amer >60 >60 mL/min    Comment: (NOTE) The eGFR has been calculated using the CKD EPI equation. This calculation has not been validated in all clinical situations. eGFR's persistently <60 mL/min signify possible Chronic Kidney Disease.    Anion gap 7 5 - 15    Comment: Performed at [facility], [place] [address] [place], [place] [address]  CBC with Differential     Status: None   Collection Time: 08/05/18  8:33 AM  Result Value Ref Range   WBC 4.6 4.0 - 10.5 K/uL   RBC 5.29 4.22 - 5.81 MIL/uL   Hemoglobin 15.5 13.0 - 17.0 g/dL   HCT 46.0 39.0 - 52.0 %   MCV 87.0 80.0 - 100.0 fL   MCH 29.3 26.0 - 34.0 pg   MCHC 33.7 30.0 - 36.0 g/dL   RDW 12.8 11.5 - 15.5 %   Platelets 278 150 - 400 K/uL   nRBC 0.0 0.0 - 0.2 %   Neutrophils Relative % 58 %   Neutro Abs 2.6 1.7 - 7.7 K/uL   Lymphocytes Relative 32 %   Lymphs Abs 1.4 0.7 - 4.0 K/uL   Monocytes Relative 10 %   Monocytes Absolute 0.5 0.1 - 1.0 K/uL   Eosinophils Relative 0 %   Eosinophils Absolute 0.0 0.0 - 0.5 K/uL   Basophils Relative 0 %   Basophils Absolute 0.0 0.0 - 0.1 K/uL   Immature Granulocytes 0 %   Abs Immature Granulocytes 0.02 0.00 - 0.07 K/uL    Comment: Performed at [facility], [place] [address], [place], [place] [address]  Troponin I - ONCE - STAT     Status: Abnormal   Collection Time: 08/05/18  8:33 AM  Result Value Ref Range   Troponin I 0.09 (HH) <0.03 ng/mL    Comment: CRITICAL RESULT CALLED TO, READ BACK BY AND VERIFIED WITH: DOWD,P. RN AT 2683 08/05/18 MULLINS,T Performed at [facility], [place] [address] [place], [place] [address]   I-stat troponin, ED     Status: None   Collection Time: 08/05/18  8:39 AM  Result Value Ref Range   Troponin i, poc 0.07 0.00 - 0.08 ng/mL   Comment 3            Comment: Due to the release kinetics of cTnI, a negative result within the first hours of the onset of symptoms does not rule out myocardial infarction with certainty. If myocardial infarction is still suspected, repeat the test at appropriate intervals.   Protime-INR     Status: None   Collection Time: 08/05/18 12:01 PM  Result Value Ref Range   Prothrombin Time 14.0 11.4 - 15.2 seconds   INR 1.09     Comment: Performed at [facility], [place] [address], [place], [place] [address]  APTT     Status: None   Collection Time: 08/05/18 12:01 PM  Result Value Ref Range   aPTT 33 24 - 36 seconds    Comment: Performed at [facility], [place] [address], [place], [place] [address]   Dg Chest 2 View  Result Date: 08/05/2018 CLINICAL DATA:  Chest pain EXAM: CHEST - 2 VIEW COMPARISON:  None. FINDINGS: Prior mitral valve repair. Mild cardiomegaly. Lungs clear. No effusions or acute bony abnormality. IMPRESSION: Mild cardiomegaly.  No active disease. Electronically Signed   By: Rolm Baptise M.D.   On: 08/05/2018 08:48   EKG Interpretation  Date/Time:  Tuesday August 05 2018 10:03:36 EST Ventricular Rate:  81 PR Interval:    QRS Duration: 85 QT Interval:  377 QTC Calculation: 438 R Axis:   77 Text Interpretation:  Sinus rhythm Left atrial enlargement Inferolateral infarct, acute Borderline ST elevation, anterior leads Confirmed by Malvin Johns [phone]) on 08/05/2018 10:15:25 AM   Pending Labs Unresulted Labs (From admission, onward)    Start     Ordered   08/06/18 0500  CBC  Daily,   R     08/05/18 1151   08/06/18 0500  Protime-INR  Daily,   R     08/05/18 1153   08/05/18 1800  Heparin level (unfractionated)  Once-Timed,   R     08/05/18 1211   Signed and Held  HIV antibody (Routine Testing)  Once,   R     Signed and Held   Signed and Held  Basic metabolic panel  Daily,   R     Signed and Held   Signed and Held  Troponin I - Now Then Q6H  Now then every 6 hours,   STAT     Signed and Held          Vitals/Pain Today's Vitals   08/05/18 1303 08/05/18 1400 08/05/18 1430 08/05/18 1500  BP: (!) 138/97 132/88 127/76 133/87  Pulse: 65 (!) 48 67 67  Resp: 16 19 (!) 21 14  Temp:      TempSrc:      SpO2: 98% 97% 98% 96%  Weight:      Height:      PainSc:        Isolation Precautions  No active isolations  Medications Medications  heparin ADULT infusion 100 units/mL (25000 units/28m  sodium chloride 0.45%) (1,300 Units/hr Intravenous New Bag/Given 08/05/18 1202)  metoprolol tartrate (LOPRESSOR) tablet 25 mg (has no administration in time range)  warfarin (COUMADIN) tablet 10 mg (has no administration in time range)  Warfarin - Pharmacist Dosing Inpatient (has no administration in time range)  ketorolac (TORADOL) 30 MG/ML injection 30 mg (30 mg Intravenous Given 08/05/18 0834)  aspirin chewable tablet 324 mg (324 mg Oral Given 08/05/18 0957)  metoprolol tartrate (LOPRESSOR) tablet 50 mg (50 mg Oral Given 08/05/18 1203)  heparin bolus via infusion 4,000 Units (4,000 Units Intravenous Bolus from Bag 08/05/18 1202)    Mobility walks

## 2018-08-06 ENCOUNTER — Inpatient Hospital Stay (HOSPITAL_COMMUNITY): Payer: 59

## 2018-08-06 ENCOUNTER — Encounter (HOSPITAL_COMMUNITY)
Admission: EM | Disposition: A | Discharge status: Home / Self Care | Payer: Self-pay | Source: Home / Self Care | Attending: Cardiology

## 2018-08-06 ENCOUNTER — Encounter (HOSPITAL_COMMUNITY): Payer: Self-pay | Admitting: Cardiology

## 2018-08-06 ENCOUNTER — Other Ambulatory Visit (HOSPITAL_COMMUNITY): Payer: Self-pay

## 2018-08-06 DIAGNOSIS — I214 Non-ST elevation (NSTEMI) myocardial infarction: Secondary | ICD-10-CM

## 2018-08-06 DIAGNOSIS — I2510 Atherosclerotic heart disease of native coronary artery without angina pectoris: Secondary | ICD-10-CM

## 2018-08-06 HISTORY — PX: LEFT HEART CATH AND CORONARY ANGIOGRAPHY: CATH118249

## 2018-08-06 HISTORY — PX: CORONARY BALLOON ANGIOPLASTY: CATH118233

## 2018-08-06 LAB — CBC
HCT: 43.5 % (ref 39.0–52.0)
Hemoglobin: 14.1 g/dL (ref 13.0–17.0)
MCH: 28.2 pg (ref 26.0–34.0)
MCHC: 32.4 g/dL (ref 30.0–36.0)
MCV: 87 fL (ref 80.0–100.0)
NRBC: 0 % (ref 0.0–0.2)
Platelets: 287 10*3/uL (ref 150–400)
RBC: 5 MIL/uL (ref 4.22–5.81)
RDW: 12.9 % (ref 11.5–15.5)
WBC: 6.3 10*3/uL (ref 4.0–10.5)

## 2018-08-06 LAB — BASIC METABOLIC PANEL
Anion gap: 9 (ref 5–15)
BUN: 12 mg/dL (ref 6–20)
CALCIUM: 9.1 mg/dL (ref 8.9–10.3)
CO2: 22 mmol/L (ref 22–32)
CREATININE: 1.06 mg/dL (ref 0.61–1.24)
Chloride: 108 mmol/L (ref 98–111)
GFR calc non Af Amer: >60 mL/min (ref >60)
Glucose, Bld: 87 mg/dL (ref 70–99)
Potassium: 3.7 mmol/L (ref 3.5–5.1)
SODIUM: 139 mmol/L (ref 135–145)

## 2018-08-06 LAB — HEPARIN LEVEL (UNFRACTIONATED): Heparin Unfractionated: 0.96 IU/mL — ABNORMAL HIGH (ref 0.30–0.70)

## 2018-08-06 LAB — ECHOCARDIOGRAM COMPLETE
Height: 70 in
Weight: 3384 oz

## 2018-08-06 LAB — HIV ANTIBODY (ROUTINE TESTING W REFLEX): HIV Screen 4th Generation wRfx: NONREACTIVE

## 2018-08-06 LAB — TROPONIN I: Troponin I: 9.81 ng/mL (ref <0.03)

## 2018-08-06 LAB — POCT ACTIVATED CLOTTING TIME: Activated Clotting Time: 285 seconds

## 2018-08-06 LAB — PROTIME-INR
INR: 1.13
Prothrombin Time: 14.4 seconds (ref 11.4–15.2)

## 2018-08-06 SURGERY — LEFT HEART CATH AND CORONARY ANGIOGRAPHY
Anesthesia: LOCAL

## 2018-08-06 MED ORDER — CLOPIDOGREL BISULFATE 75 MG PO TABS
75.0000 mg | ORAL_TABLET | Freq: Every day | ORAL | Status: DC
  Administered 2018-08-07: 08:00:00 75 mg via ORAL
  Filled 2018-08-06: qty 1

## 2018-08-06 MED ORDER — TIROFIBAN HCL IN NACL 5-0.9 MG/100ML-% IV SOLN
INTRAVENOUS | Status: AC
  Filled 2018-08-06: qty 100

## 2018-08-06 MED ORDER — TIROFIBAN (AGGRASTAT) BOLUS VIA INFUSION
INTRAVENOUS | Status: DC | PRN
  Administered 2018-08-06: 2397.5 ug via INTRAVENOUS

## 2018-08-06 MED ORDER — HEPARIN (PORCINE) IN NACL 1000-0.9 UT/500ML-% IV SOLN
INTRAVENOUS | Status: DC | PRN
  Administered 2018-08-06: 500 mL

## 2018-08-06 MED ORDER — FENTANYL CITRATE (PF) 100 MCG/2ML IJ SOLN
INTRAMUSCULAR | Status: AC
  Filled 2018-08-06: qty 2

## 2018-08-06 MED ORDER — FAMOTIDINE IN NACL 20-0.9 MG/50ML-% IV SOLN
20.0000 mg | Freq: Once | INTRAVENOUS | Status: AC
  Administered 2018-08-06: 12:00:00 20 mg via INTRAVENOUS
  Filled 2018-08-06: qty 50

## 2018-08-06 MED ORDER — SODIUM CHLORIDE 0.9 % IV SOLN
INTRAVENOUS | Status: AC
  Administered 2018-08-06: 12:00:00 via INTRAVENOUS

## 2018-08-06 MED ORDER — SODIUM CHLORIDE 0.9 % WEIGHT BASED INFUSION
3.0000 mL/kg/h | INTRAVENOUS | Status: DC
  Administered 2018-08-06: 3 mL/kg/h via INTRAVENOUS

## 2018-08-06 MED ORDER — CLOPIDOGREL BISULFATE 300 MG PO TABS
ORAL_TABLET | ORAL | Status: DC | PRN
  Administered 2018-08-06: 600 mg via ORAL

## 2018-08-06 MED ORDER — MIDAZOLAM HCL 2 MG/2ML IJ SOLN
INTRAMUSCULAR | Status: DC | PRN
  Administered 2018-08-06: 2 mg via INTRAVENOUS

## 2018-08-06 MED ORDER — NITROGLYCERIN 1 MG/10 ML FOR IR/CATH LAB
INTRA_ARTERIAL | Status: DC | PRN
  Administered 2018-08-06: 200 ug via INTRACORONARY

## 2018-08-06 MED ORDER — SODIUM CHLORIDE 0.9 % IV SOLN: 250.0000 mL | INTRAVENOUS | Status: DC | PRN

## 2018-08-06 MED ORDER — SODIUM CHLORIDE 0.9% FLUSH: 3.0000 mL | INTRAVENOUS | Status: DC | PRN

## 2018-08-06 MED ORDER — IOHEXOL 350 MG/ML SOLN
INTRAVENOUS | Status: DC | PRN
  Administered 2018-08-06: 180 mL via INTRA_ARTERIAL

## 2018-08-06 MED ORDER — WARFARIN - PHARMACIST DOSING INPATIENT
Freq: Every day | Status: DC
  Administered 2018-08-06: 18:00:00

## 2018-08-06 MED ORDER — NITROGLYCERIN 1 MG/10 ML FOR IR/CATH LAB
INTRA_ARTERIAL | Status: AC
  Filled 2018-08-06: qty 10

## 2018-08-06 MED ORDER — HEPARIN SODIUM (PORCINE) 1000 UNIT/ML IJ SOLN
INTRAMUSCULAR | Status: DC | PRN
  Administered 2018-08-06 (×2): 5000 [IU] via INTRAVENOUS

## 2018-08-06 MED ORDER — LABETALOL HCL 5 MG/ML IV SOLN: 10.0000 mg | INTRAVENOUS | Status: AC | PRN

## 2018-08-06 MED ORDER — ASPIRIN 81 MG PO CHEW
81.0000 mg | CHEWABLE_TABLET | ORAL | Status: AC
  Administered 2018-08-06: 81 mg via ORAL
  Filled 2018-08-06: qty 1

## 2018-08-06 MED ORDER — SODIUM CHLORIDE 0.9 % WEIGHT BASED INFUSION: 1.0000 mL/kg/h | INTRAVENOUS | Status: DC

## 2018-08-06 MED ORDER — ANGIOPLASTY BOOK
Freq: Once | Status: AC
  Administered 2018-08-07: 03:00:00
  Filled 2018-08-06: qty 1

## 2018-08-06 MED ORDER — VERAPAMIL HCL 2.5 MG/ML IV SOLN
INTRAVENOUS | Status: AC
  Filled 2018-08-06: qty 2

## 2018-08-06 MED ORDER — HEPARIN (PORCINE) 25000 UT/250ML-% IV SOLN
1400.0000 [IU]/h | INTRAVENOUS | Status: DC
  Administered 2018-08-06: 19:00:00 1400 [IU]/h via INTRAVENOUS
  Filled 2018-08-06: qty 250

## 2018-08-06 MED ORDER — SODIUM CHLORIDE 0.9% FLUSH
3.0000 mL | Freq: Two times a day (BID) | INTRAVENOUS | Status: DC
  Administered 2018-08-06: 3 mL via INTRAVENOUS

## 2018-08-06 MED ORDER — VERAPAMIL HCL 2.5 MG/ML IV SOLN
INTRAVENOUS | Status: DC | PRN
  Administered 2018-08-06: 10:00:00 via INTRA_ARTERIAL

## 2018-08-06 MED ORDER — HYDRALAZINE HCL 20 MG/ML IJ SOLN: 5.0000 mg | INTRAMUSCULAR | Status: AC | PRN

## 2018-08-06 MED ORDER — THE SENSUOUS HEART BOOK
Freq: Once | Status: AC
  Administered 2018-08-07: 03:00:00
  Filled 2018-08-06: qty 1

## 2018-08-06 MED ORDER — HEART ATTACK BOUNCING BOOK
Freq: Once | Status: AC
  Administered 2018-08-07: 03:00:00
  Filled 2018-08-06: qty 1

## 2018-08-06 MED ORDER — TIROFIBAN HCL IV 12.5 MG/250 ML: 0.1500 ug/kg/min | INTRAVENOUS | Status: AC

## 2018-08-06 MED ORDER — LIDOCAINE HCL (PF) 1 % IJ SOLN
INTRAMUSCULAR | Status: AC
  Filled 2018-08-06: qty 30

## 2018-08-06 MED ORDER — CLOPIDOGREL BISULFATE 300 MG PO TABS
ORAL_TABLET | ORAL | Status: AC
  Filled 2018-08-06: qty 2

## 2018-08-06 MED ORDER — TIROFIBAN HCL IN NACL 5-0.9 MG/100ML-% IV SOLN
INTRAVENOUS | Status: AC | PRN
  Administered 2018-08-06: 0.15 ug/kg/min via INTRAVENOUS

## 2018-08-06 MED ORDER — MIDAZOLAM HCL 2 MG/2ML IJ SOLN
INTRAMUSCULAR | Status: AC
  Filled 2018-08-06: qty 2

## 2018-08-06 MED ORDER — LIDOCAINE HCL (PF) 1 % IJ SOLN
INTRAMUSCULAR | Status: DC | PRN
  Administered 2018-08-06: 2 mL

## 2018-08-06 MED ORDER — FENTANYL CITRATE (PF) 100 MCG/2ML IJ SOLN
INTRAMUSCULAR | Status: DC | PRN
  Administered 2018-08-06: 25 ug via INTRAVENOUS

## 2018-08-06 MED ORDER — WARFARIN SODIUM 6 MG PO TABS
12.0000 mg | ORAL_TABLET | Freq: Once | ORAL | Status: AC
  Administered 2018-08-06: 12 mg via ORAL
  Filled 2018-08-06: qty 2

## 2018-08-06 MED ORDER — SODIUM CHLORIDE 0.9 % WEIGHT BASED INFUSION: 3.0000 mL/kg/h | INTRAVENOUS | Status: DC

## 2018-08-06 MED ORDER — SODIUM CHLORIDE 0.9% FLUSH
3.0000 mL | Freq: Two times a day (BID) | INTRAVENOUS | Status: DC
  Administered 2018-08-07: 3 mL via INTRAVENOUS

## 2018-08-06 SURGICAL SUPPLY — 16 items
BALLN SAPPHIRE 2.0X12 (BALLOONS) ×2
BALLOON SAPPHIRE 2.0X12 (BALLOONS) ×1 IMPLANT
CATH INFINITI 5 FR JL3.5 (CATHETERS) ×2 IMPLANT
CATH LAUNCHER 6FR EBU 3.5 SH (CATHETERS) ×2 IMPLANT
CATH OPTITORQUE TIG 4.0 5F (CATHETERS) ×2 IMPLANT
DEVICE RAD COMP TR BAND LRG (VASCULAR PRODUCTS) ×2 IMPLANT
GLIDESHEATH SLEND SS 6F .021 (SHEATH) ×2 IMPLANT
GUIDEWIRE INQWIRE 1.5J.035X260 (WIRE) ×1 IMPLANT
INQWIRE 1.5J .035X260CM (WIRE) ×2
KIT ENCORE 26 ADVANTAGE (KITS) ×2 IMPLANT
KIT HEART LEFT (KITS) ×2 IMPLANT
PACK CARDIAC CATHETERIZATION (CUSTOM PROCEDURE TRAY) ×2 IMPLANT
SHEATH PROBE COVER 6X72 (BAG) ×2 IMPLANT
TRANSDUCER W/STOPCOCK (MISCELLANEOUS) ×2 IMPLANT
TUBING CIL FLEX 10 FLL-RA (TUBING) ×2 IMPLANT
WIRE ASAHI PROWATER 180CM (WIRE) ×2 IMPLANT

## 2018-08-06 NOTE — Progress Notes (Signed)
TR BAND REMOVAL  LOCATION:  right radial  DEFLATED PER PROTOCOL:  Yes.    TIME BAND OFF / DRESSING APPLIED:   1500   SITE UPON ARRIVAL:   Level 0  SITE AFTER BAND REMOVAL:  Level 0  CIRCULATION SENSATION AND MOVEMENT:  Within Normal Limits  Yes.    COMMENTS:    

## 2018-08-06 NOTE — Progress Notes (Signed)
  Echocardiogram 2D Echocardiogram has been performed.  Leta JunglingCooper, Adith Tejada M 08/06/2018, 9:32 AM

## 2018-08-06 NOTE — Progress Notes (Addendum)
Progress Note  Patient Name: Benjamin Rodriguez Date of Encounter: 08/06/2018  Primary Cardiologist: [facility]  Subjective   No issues overnight. Awaiting cath today.   Inpatient Medications    Scheduled Meds: . aspirin EC  81 mg Oral Daily  . atorvastatin  40 mg Oral q1800  . metoprolol tartrate  25 mg Oral BID  . sodium chloride flush  3 mL Intravenous Q12H   Continuous Infusions: . sodium chloride    . sodium chloride    . heparin 1,600 Units/hr (08/06/18 0418)   PRN Meds: sodium chloride, acetaminophen, nitroGLYCERIN, nitroGLYCERIN, ondansetron (ZOFRAN) IV, sodium chloride flush   Vital Signs    Vitals:   08/05/18 1530 08/05/18 1703 08/05/18 2111 08/06/18 0450  BP: 133/87 (!) 140/92 138/83 126/85  Pulse: 68 70 67 65  Resp: 13   17  Temp:  98 F (36.7 C) 98 F (36.7 C) 98.1 F (36.7 C)  TempSrc:  Oral Oral Oral  SpO2: 93% 98% 99% 99%  Weight:  95.8 kg  95.9 kg  Height:  5\' 10"  (1.778 m)      Intake/Output Summary (Last 24 hours) at 08/06/2018 0925 Last data filed at 08/06/2018 0520 Gross per 24 hour  Intake 525.83 ml  Output -  Net 525.83 ml   Filed Weights   08/05/18 0752 08/05/18 1703 08/06/18 0450  Weight: 96.2 kg 95.8 kg 95.9 kg    Telemetry    NSR - Personally Reviewed  ECG    NSR, incomplete RBBB, nonspecific ST/TW abnormalties - Personally Reviewed  Physical Exam   GEN: No acute distress.   Neck: No JVD Cardiac: RRR, no murmurs, rubs, or gallops.  Respiratory: Clear to auscultation bilaterally. GI: Soft, nontender, non-distended  MS: No edema; No deformity. Neuro:  Nonfocal  Psych: Normal affect   Labs    Chemistry Recent Labs  Lab 08/05/18 0833 08/06/18 0404  NA 139 139  K 4.1 3.7  CL 104 108  CO2 28 22  GLUCOSE 97 87  BUN 11 12  CREATININE 1.04 1.06  CALCIUM 9.2 9.1  GFRNONAA >60 >60  GFRAA >60 >60  ANIONGAP 7 9     Hematology Recent Labs  Lab 08/05/18 0833 08/06/18 0404  WBC 4.6 6.3  RBC 5.29 5.00  HGB 15.5  14.1  HCT 46.0 43.5  MCV 87.0 87.0  MCH 29.3 28.2  MCHC 33.7 32.4  RDW 12.8 12.9  PLT 278 287    Cardiac Enzymes Recent Labs  Lab 08/05/18 0833 08/05/18 1707 08/05/18 2246 08/06/18 0404  TROPONINI 0.09* 13.49* 15.91* 9.81*    Recent Labs  Lab 08/05/18 0839  TROPIPOC 0.07     BNPNo results for input(s): BNP, PROBNP in the last 168 hours.   DDimer No results for input(s): DDIMER in the last 168 hours.   Radiology    Dg Chest 2 View  Result Date: 08/05/2018 CLINICAL DATA:  Chest pain EXAM: CHEST - 2 VIEW COMPARISON:  None. FINDINGS: Prior mitral valve repair. Mild cardiomegaly. Lungs clear. No effusions or acute bony abnormality. IMPRESSION: Mild cardiomegaly.  No active disease. Electronically Signed   By: Charlett Nose M.D.   On: 08/05/2018 08:48    Cardiac Studies   LHC- pending  2D echo- pending   Patient Profile     Benjamin Rodriguez is a 31 y.o. male w/ h/o severe mitral regurgitation, s/p mechanical mitral valve replacement at [facility] in 2016 and chronic anticoagulation therapy w/ Coumadin, presenting with chest pain for the last few  weeks. INR was subtherapeutic on arrival at 1.09. Has ruled in for NSTEMI with troponin peaking at 15.91. Awaiting LHC.   Assessment & Plan    1. NSTEMI/ Chest Pain: Troponin peaked at 15.91 and trending down. Last troponin I was 9.81. He is on IV heparin and awaiting cath. INR this morning was 1.13. Continue ASA, statin and BB. If he proves to have CAD on cath then target LDL goal will be < 70 mg/dL. Will check FLP.   2. Mechanical Mitral Valve: s/p MV [facility] in [facility]. He is on coumadin for a/c however presented with subtherapeutic INR at 1.09. He is now on IV heparin. Will continue heparin until all invasive procedures are completed and he will need IV heparin bridge post cath until therapeutic INR, once coumadin is resumed. We will plan to have interventional cardiologist perform fluoroscopy to assess mechanical valve mobility.  2D echo also pending. Pharmacy managing heparin and will assist with Coumadin dosing post procedures.    For questions or updates, please contact [facility] Please consult [email or web address] for contact info under    Signed, Robbie LisBrittainy Simmons, PA-C  08/06/2018, 9:25 AM    The patient was seen, examined and discussed with Brittainy M. Sharol HarnessSimmons, PA-C and I agree with the above.   Patient's troponin elevated up to 13 yesterday, coronary CTA was counseled, patient underwent left cardiac catheterization with fluoroscopy of his mechanical mitral valve this morning, fluoroscopy showed good mobility of 1 of the leaflets but limited mobility of the other that most probably represents mild thrombosis.  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 Suspect thromboembolic subtotal occlusion of distal-mid LAD with successful balloon angioplasty restoring TIMI 3 flow, but resulting in occlusion of ~741mm distal diagonal branch.  Ventriculogram shows preserved LVEF with apical anterior hypokinesis.  The patient was started on short course of Aggrastat for 4 hours and IV heparin will be started after she removal.  Warfarin will be started tonight.  Recommendation is for use of Coumadin, aspirin and clopidogrel for minimum of 1 months and then up to 1 year of clopidogrel alone along with warfarin, after that warfarin only.  Goal for INR will be 2.5-3.5.  We will continue low-dose metoprolol and high dose of atorvastatin.  Anticipated discharge tomorrow.  Tobias AlexanderKatarina Amanat Hackel, MD 08/06/2018

## 2018-08-06 NOTE — Interval H&P Note (Signed)
History and Physical Interval Note:  08/06/2018 9:26 AM  Benjamin Rodriguez  has presented today for surgery, with the diagnosis of unstable angina /NSTEMI.  After initial minimal troponin elevation, with subsequent elevation to greater than 13 consistent with non-STEMI.   The various methods of treatment have been discussed with the patient and family. After consideration of risks, benefits and other options for treatment, the patient has consented to  Procedure(s): LEFT HEART CATH AND CORONARY ANGIOGRAPHY (N/A) with possible PERCUTANEOUS CORONARY INTERVENTION As a surgical intervention .  The patient's history has been reviewed, patient examined, no change in status, stable for surgery.  I have reviewed the patient's chart and labs.  Questions were answered to the patient's satisfaction.    Cath Lab Visit (complete for each Cath Lab visit)  Clinical Evaluation Leading to the Procedure:   ACS: No.  Non-ACS:    Anginal Classification: No Symptoms  Anti-ischemic medical therapy: Minimal Therapy (1 class of medications)  Non-Invasive Test Results: No non-invasive testing performed  Prior CABG: No previous CABG  Bryan Lemmaavid Shantinique Picazo

## 2018-08-06 NOTE — Progress Notes (Signed)
ANTICOAGULATION CONSULT NOTE - Follow Up Consult  Pharmacy Consult for Heparin/Coumadin Indication: chest pain/ACS/MVR  No Known Allergies  Patient Measurements: Height: 5\' 10"  (177.8 cm) Weight: 211 lb 8 oz (95.9 kg) IBW/kg (Calculated) : 73 Heparin Dosing Weight: 93 kg  Vital Signs: Temp: 98.1 F (36.7 C) (11/13 0450) Temp Source: Oral (11/13 0450) BP: 139/95 (11/13 1053) Pulse Rate: 67 (11/13 1053)  Labs: Recent Labs    08/05/18 0833 08/05/18 1201 08/05/18 1707 08/05/18 1907 08/05/18 2246 08/06/18 0404 08/06/18 0841  HGB 15.5  --   --   --   --  14.1  --   HCT 46.0  --   --   --   --  43.5  --   PLT 278  --   --   --   --  287  --   APTT  --  33  --   --   --   --   --   LABPROT  --  14.0  --   --   --  14.4  --   INR  --  1.09  --   --   --  1.13  --   HEPARINUNFRC  --   --   --  0.16*  --   --  0.96*  CREATININE 1.04  --   --   --   --  1.06  --   TROPONINI 0.09*  --  13.49*  --  15.91* 9.81*  --     Estimated Creatinine Clearance: 115.2 mL/min (by C-G formula based on SCr of 1.06 mg/dL).   Medical History: Past Medical History:  Diagnosis Date  . 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  S/p LHC with balloon angioplasty. MD suspects mechanical mitral valve to be likely source of thromboembolism. Pharmacy consulted to resume IV heparin 8 hours post sheath removal and restart warfarin this evening. INR 1.13 today   Home warfarin dose: 12 mg daily   Goal of Therapy:  INR 2.5-3.5 Heparin level 0.3-0.7 units/ml Monitor platelets by anticoagulation protocol: Yes   Plan:  Resume heparin at 1400 units/hr at 1900 tonight  Check anti-Xa level in 6 hours and daily while on heparin Warfarin 12 mg today  Tirofiban infusion x 4 hours post cath per MD  Daily  INR Continue to monitor H&H and platelets  Vinnie LevelBenjamin Mostyn Varnell, PharmD., BCPS Clinical Pharmacist Clinical phone for 08/06/18 until 3:30pm: [phone] If after 3:30pm, please refer to [facility] for unit-specific pharmacist

## 2018-08-07 ENCOUNTER — Encounter (HOSPITAL_COMMUNITY): Payer: Self-pay | Admitting: Cardiology

## 2018-08-07 DIAGNOSIS — Z952 Presence of prosthetic heart valve: Secondary | ICD-10-CM

## 2018-08-07 DIAGNOSIS — Z7901 Long term (current) use of anticoagulants: Secondary | ICD-10-CM

## 2018-08-07 LAB — PROTIME-INR
INR: 1.06
Prothrombin Time: 13.7 seconds (ref 11.4–15.2)

## 2018-08-07 LAB — CBC
HEMATOCRIT: 41.6 % (ref 39.0–52.0)
Hemoglobin: 14 g/dL (ref 13.0–17.0)
MCH: 29.1 pg (ref 26.0–34.0)
MCHC: 33.7 g/dL (ref 30.0–36.0)
MCV: 86.5 fL (ref 80.0–100.0)
Platelets: 265 10*3/uL (ref 150–400)
RBC: 4.81 MIL/uL (ref 4.22–5.81)
RDW: 12.8 % (ref 11.5–15.5)
WBC: 6.3 10*3/uL (ref 4.0–10.5)
nRBC: 0 % (ref 0.0–0.2)

## 2018-08-07 LAB — LIPID PANEL
Cholesterol: 205 mg/dL — ABNORMAL HIGH (ref 0–200)
HDL: 36 mg/dL — ABNORMAL LOW (ref >40)
LDL CALC: 135 mg/dL — AB (ref 0–99)
TRIGLYCERIDES: 168 mg/dL — AB (ref <150)
Total CHOL/HDL Ratio: 5.7 RATIO
VLDL: 34 mg/dL (ref 0–40)

## 2018-08-07 LAB — BASIC METABOLIC PANEL
ANION GAP: 5 (ref 5–15)
BUN: 7 mg/dL (ref 6–20)
CHLORIDE: 107 mmol/L (ref 98–111)
CO2: 26 mmol/L (ref 22–32)
Calcium: 9 mg/dL (ref 8.9–10.3)
Creatinine, Ser: 1.09 mg/dL (ref 0.61–1.24)
GFR calc Af Amer: >60 mL/min (ref >60)
GFR calc non Af Amer: >60 mL/min (ref >60)
GLUCOSE: 101 mg/dL — AB (ref 70–99)
POTASSIUM: 3.5 mmol/L (ref 3.5–5.1)
Sodium: 138 mmol/L (ref 135–145)

## 2018-08-07 LAB — HEPARIN LEVEL (UNFRACTIONATED): Heparin Unfractionated: 0.47 IU/mL (ref 0.30–0.70)

## 2018-08-07 MED ORDER — ATORVASTATIN CALCIUM 40 MG PO TABS: 40.0000 mg | ORAL_TABLET | Freq: Every day | ORAL | 3 refills | Status: DC

## 2018-08-07 MED ORDER — LOSARTAN POTASSIUM 50 MG PO TABS: 50.0000 mg | ORAL_TABLET | Freq: Every day | ORAL | 3 refills | Status: DC

## 2018-08-07 MED ORDER — METOPROLOL TARTRATE 25 MG PO TABS: 25.0000 mg | ORAL_TABLET | Freq: Two times a day (BID) | ORAL | 3 refills | Status: DC

## 2018-08-07 MED ORDER — WARFARIN SODIUM 7.5 MG PO TABS: 15.0000 mg | ORAL_TABLET | Freq: Once | ORAL | Status: DC

## 2018-08-07 MED ORDER — LOSARTAN POTASSIUM 50 MG PO TABS: 50.0000 mg | ORAL_TABLET | Freq: Every day | ORAL | Status: DC

## 2018-08-07 MED ORDER — ENOXAPARIN SODIUM 100 MG/ML ~~LOC~~ SOLN: 100.0000 mg | Freq: Two times a day (BID) | SUBCUTANEOUS | 0 refills | Status: DC

## 2018-08-07 MED ORDER — ENOXAPARIN SODIUM 100 MG/ML ~~LOC~~ SOLN
100.0000 mg | Freq: Once | SUBCUTANEOUS | Status: AC
  Administered 2018-08-07: 100 mg via SUBCUTANEOUS
  Filled 2018-08-07: qty 1

## 2018-08-07 MED ORDER — WARFARIN SODIUM 6 MG PO TABS: 12.0000 mg | ORAL_TABLET | Freq: Every day | ORAL | 5 refills | Status: DC

## 2018-08-07 MED ORDER — CLOPIDOGREL BISULFATE 75 MG PO TABS: 75.0000 mg | ORAL_TABLET | Freq: Every day | ORAL | 3 refills | Status: DC

## 2018-08-07 MED ORDER — ASPIRIN 81 MG PO TBEC: 81.0000 mg | DELAYED_RELEASE_TABLET | Freq: Every day | ORAL | 0 refills | Status: DC

## 2018-08-07 MED ORDER — WARFARIN SODIUM 6 MG PO TABS
12.0000 mg | ORAL_TABLET | Freq: Every day | ORAL | Status: DC
  Filled 2018-08-07: qty 2

## 2018-08-07 MED ORDER — ENOXAPARIN SODIUM 100 MG/ML ~~LOC~~ SOLN: 100.0000 mg | Freq: Two times a day (BID) | SUBCUTANEOUS | Status: DC

## 2018-08-07 MED ORDER — NITROGLYCERIN 0.4 MG SL SUBL: 0.4000 mg | SUBLINGUAL_TABLET | SUBLINGUAL | 12 refills | Status: DC | PRN

## 2018-08-07 MED FILL — LOSARTAN POTASSIUM 50 MG TA: 50 | 90 days supply | Qty: 90 | Fill #0

## 2018-08-07 MED FILL — NITROGLYCERIN 0.4 MG TAB SL: 0.4 | 25 days supply | Qty: 25 | Fill #0

## 2018-08-07 MED FILL — ENOXAPARIN 100 MG/ML SYR: 100 | 7 days supply | Qty: 14 | Fill #0

## 2018-08-07 MED FILL — ATORVASTATIN CALCIUM 40 MG: 40 | 90 days supply | Qty: 90 | Fill #0

## 2018-08-07 MED FILL — METOPROLOL TARTRATE 25 MG T: 25 | 90 days supply | Qty: 180 | Fill #0

## 2018-08-07 MED FILL — ASPIRIN ADULT LOW STRENGTH: 81 | 30 days supply | Qty: 30 | Fill #0

## 2018-08-07 MED FILL — CLOPIDOGREL 75 MG TABLET: 75 | 90 days supply | Qty: 90 | Fill #0

## 2018-08-07 MED FILL — WARFARIN SODIUM 6 MG TABLET: 6 | 30 days supply | Qty: 60 | Fill #0

## 2018-08-07 NOTE — Discharge Instructions (Signed)
Enoxaparin injection °What is this medicine? °ENOXAPARIN (ee nox a PA rin) is used after knee, hip, or abdominal surgeries to prevent blood clotting. It is also used to treat existing blood clots in the lungs or in the veins. °This medicine may be used for other purposes; ask your health care provider or pharmacist if you have questions. °COMMON BRAND NAME(S): Lovenox °What should I tell my health care provider before I take this medicine? °They need to know if you have any of these conditions: °-bleeding disorders, hemorrhage, or hemophilia °-infection of the heart or heart valves °-kidney or liver disease °-previous stroke °-prosthetic heart valve °-recent surgery or delivery of a baby °-ulcer in the stomach or intestine, diverticulitis, or other bowel disease °-an unusual or allergic reaction to enoxaparin, heparin, pork or pork products, other medicines, foods, dyes, or preservatives °-pregnant or trying to get pregnant °-breast-feeding °How should I use this medicine? °This medicine is for injection under the skin. It is usually given by a health-care professional. You or a family member may be trained on how to give the injections. If you are to give yourself injections, make sure you understand how to use the syringe, measure the dose if necessary, and give the injection. To avoid bruising, do not rub the site where this medicine has been injected. Do not take your medicine more often than directed. Do not stop taking except on the advice of your doctor or health care professional. °Make sure you receive a puncture-resistant container to dispose of the needles and syringes once you have finished with them. Do not reuse these items. Return the container to your doctor or health care professional for proper disposal. °Talk to your pediatrician regarding the use of this medicine in children. Special care may be needed. °Overdosage: If you think you have taken too much of this medicine contact a poison control  center or emergency room at once. °NOTE: This medicine is only for you. Do not share this medicine with others. °What if I miss a dose? °If you miss a dose, take it as soon as you can. If it is almost time for your next dose, take only that dose. Do not take double or extra doses. °What may interact with this medicine? °-aspirin and aspirin-like medicines °-certain medicines that treat or prevent blood clots °-dipyridamole °-NSAIDs, medicines for pain and inflammation, like ibuprofen or naproxen °This list may not describe all possible interactions. Give your health care provider a list of all the medicines, herbs, non-prescription drugs, or dietary supplements you use. Also tell them if you smoke, drink alcohol, or use illegal drugs. Some items may interact with your medicine. °What should I watch for while using this medicine? °Visit your doctor or health care professional for regular checks on your progress. Your condition will be monitored carefully while you are receiving this medicine. °Notify your doctor or health care professional and seek emergency treatment if you develop breathing problems; changes in vision; chest pain; severe, sudden headache; pain, swelling, warmth in the leg; trouble speaking; sudden numbness or weakness of the face, arm, or leg. These can be signs that your condition has gotten worse. °If you are going to have surgery, tell your doctor or health care professional that you are taking this medicine. °Do not stop taking this medicine without first talking to your doctor. Be sure to refill your prescription before you run out of medicine. °Avoid sports and activities that might cause injury while you are using this medicine. Severe   falls or injuries can cause unseen bleeding. Be careful when using sharp tools or knives. Consider using an Neurosurgeonelectric razor. Take special care brushing or flossing your teeth. Report any injuries, bruising, or red spots on the skin to your doctor or health care  professional. What side effects may I notice from receiving this medicine? Side effects that you should report to your doctor or health care professional as soon as possible: -allergic reactions like skin rash, itching or hives, swelling of the face, lips, or tongue -feeling faint or lightheaded, falls -signs and symptoms of bleeding such as bloody or black, tarry stools; red or dark-brown urine; spitting up blood or brown material that looks like coffee grounds; red spots on the skin; unusual bruising or bleeding from the eye, gums, or nose Side effects that usually do not require medical attention (report to your doctor or health care professional if they continue or are bothersome): -pain, redness, or irritation at site where injected This list may not describe all possible side effects. Call your doctor for medical advice about side effects. You may report side effects to FDA at 1-800-FDA-1088. Where should I keep my medicine? Keep out of the reach of children. Store at room temperature between 15 and 30 degrees C (59 and 86 degrees F). Do not freeze. If your injections have been specially prepared, you may need to store them in the refrigerator. Ask your pharmacist. Throw away any unused medicine after the expiration date. NOTE: This sheet is a summary. It may not cover all possible information. If you have questions about this medicine, talk to your doctor, pharmacist, or health care provider.  2018 Elsevier/Gold Standard (2014-01-12 16:06:21)   Radial Site Care Refer to this sheet in the next few weeks. These instructions provide you with information about caring for yourself after your procedure. Your health care provider may also give you more specific instructions. Your treatment has been planned according to current medical practices, but problems sometimes occur. Call your health care provider if you have any problems or questions after your procedure. What can I expect after the  procedure? After your procedure, it is typical to have the following:  Bruising at the radial site that usually fades within 1-2 weeks.  Blood collecting in the tissue (hematoma) that may be painful to the touch. It should usually decrease in size and tenderness within 1-2 weeks.  Follow these instructions at home:  Take medicines only as directed by your health care provider.  You may shower 24-48 hours after the procedure or as directed by your health care provider. Remove the bandage (dressing) and gently wash the site with plain soap and water. Pat the area dry with a clean towel. Do not rub the site, because this may cause bleeding.  Do not take baths, swim, or use a hot tub until your health care provider approves.  Check your insertion site every day for redness, swelling, or drainage.  Do not apply powder or lotion to the site.  Do not flex or bend the affected arm for 24 hours or as directed by your health care provider.  Do not push or pull heavy objects with the affected arm for 24 hours or as directed by your health care provider.  Do not lift over 10 lb (4.5 kg) for 5 days after your procedure or as directed by your health care provider.  Ask your health care provider when it is okay to: ? Return to work or school. ? Resume usual  physical activities or sports. ? Resume sexual activity.  Do not drive home if you are discharged the same day as the procedure. Have someone else drive you.  You may drive 24 hours after the procedure unless otherwise instructed by your health care provider.  Do not operate machinery or power tools for 24 hours after the procedure.  If your procedure was done as an outpatient procedure, which means that you went home the same day as your procedure, a responsible adult should be with you for the first 24 hours after you arrive home.  Keep all follow-up visits as directed by your health care provider. This is important. Contact a health  care provider if:  You have a fever.  You have chills.  You have increased bleeding from the radial site. Hold pressure on the site. Get help right away if:  You have unusual pain at the radial site.  You have redness, warmth, or swelling at the radial site.  You have drainage (other than a small amount of blood on the dressing) from the radial site.  The radial site is bleeding, and the bleeding does not stop after 30 minutes of holding steady pressure on the site.  Your arm or hand becomes pale, cool, tingly, or numb. This information is not intended to replace advice given to you by your health care provider. Make sure you discuss any questions you have with your health care provider. Document Released: 10/13/2010 Document Revised: 02/16/2016 Document Reviewed: 03/29/2014 Elsevier Interactive Patient Education  2018 [employer].

## 2018-08-07 NOTE — Progress Notes (Signed)
ANTICOAGULATION CONSULT NOTE   Pharmacy Consult for Heparin/Coumadin Indication: h/o MVR  No Known Allergies  Patient Measurements: Height: 5\' 10"  (177.8 cm) Weight: 211 lb 8 oz (95.9 kg) IBW/kg (Calculated) : 73  Heparin Dosing Weight: 93 kg  Vital Signs: Temp: 97.5 F (36.4 C) (11/13 2004) Temp Source: Oral (11/13 2004) BP: 151/100 (11/13 2004) Pulse Rate: 66 (11/13 2004)  Labs: Recent Labs    08/05/18 0833 08/05/18 1201 08/05/18 1707 08/05/18 1907 08/05/18 2246 08/06/18 0404 08/06/18 0841 08/07/18 0055  HGB 15.5  --   --   --   --  14.1  --  14.0  HCT 46.0  --   --   --   --  43.5  --  41.6  PLT 278  --   --   --   --  287  --  265  APTT  --  33  --   --   --   --   --   --   LABPROT  --  14.0  --   --   --  14.4  --  13.7  INR  --  1.09  --   --   --  1.13  --  1.06  HEPARINUNFRC  --   --   --  0.16*  --   --  0.96* 0.47  CREATININE 1.04  --   --   --   --  1.06  --  1.09  TROPONINI 0.09*  --  13.49*  --  15.91* 9.81*  --   --     Estimated Creatinine Clearance: 112.1 mL/min (by C-G formula based on SCr of 1.09 mg/dL).  Assessment: 32 y/o Male with St Jude mechanical valve s/p angioplasty for anticoagulation   Goal of Therapy:  INR 2.5-3.5 Heparin level 0.3-0.7 units/ml Monitor platelets by anticoagulation protocol: Yes   Plan:  Continue Heparin at current rate   Coumadin 15 mg today  Geannie RisenGreg Timmy Bubeck, PharmD, BCPS

## 2018-08-07 NOTE — Discharge Summary (Signed)
Discharge Summary    Patient ID: Benjamin Rodriguez MRN: [id number]; DOB: March 02, 1986  Admit date: 08/05/2018 Discharge date: 08/07/2018  Primary Care Provider: Patient, No Pcp Per  Primary Cardiologist: Benjamin Alexander, MD  Primary Electrophysiologist:  None   Discharge Diagnoses    Active Problems:   Unstable angina (HCC)   Non-ST elevation (NSTEMI) myocardial infarction [facility])   H/O mitral valve replacement with mechanical valve   Anticoagulant long-term use   Allergies No Known Allergies  Diagnostic Studies/Procedures    CORONARY BALLOON ANGIOPLASTY  LEFT HEART CATH AND CORONARY ANGIOGRAPHY 08/06/2018  Conclusion    CULPRIT LESION: Mid LAD lesion is 95% stenosed with 75% stenosed side branch in Ost 2nd Diag.  Balloon angioplasty was performed using a BALLOON SAPPHIRE 2.0X12. 100% residual stenosis of small 2nd Diag  Post intervention, there is a 0% residual stenosis in the LAD, WITH .  LV end diastolic pressure is moderately elevated.  The left ventricular systolic function is low-normal. The left ventricular ejection fraction is 50-55% by visual estimate.  There is no mitral valve regurgitation. Mechanical valve appears to have 1 of 2 tilting-disc's nonfunctional.   SUMMARY Suspect thromboembolic subtotal occlusion of distal-mid LAD with successful balloon angioplasty restoring TIMI 3 flow, but resulting in occlusion of ~90mm distal diagonal branch. Normal EF with apical anterior hypokinesis. Apparent mild single leaflet dysfunction of Tilting Disc mechanical Mitral Valve = likely source of thromboembolism.   RECOMMENDATIONS  Return to nursing unit for ongoing care and TR band removal.  With plaque shift of thrombus into the small distal diagonal branch, will run short course of Aggrastat (roughly 4 hours)  Restart IV heparin 8 hours after sheath removal.  Restart warfarin tonight  Recommend uninterrupted dual antiplatelet therapy with Aspirin 81mg  daily and  Clopidogrel 75mg  daily for a minimum of 1 month DAPT, then 2 more months Clopidogrel alone (along with Warfarin).    Benjamin Rodriguez, M.D., M.S. Interventional Cardiologist    Diagnostic Diagram       Post-Intervention Diagram        ____________________________   Echocardiogram 08/06/2018 Study Conclusions - Left ventricle: The cavity size was normal. Wall thickness was   increased in a pattern of mild LVH. Systolic function was normal.   The estimated ejection fraction was in the range of 50% to 55%.   The study is not technically sufficient to allow evaluation of LV   diastolic function. - Mitral valve: Mechanical MVR - normal leaflet motion. No   obstruction. Mean gradient (D): 4 mm Hg. Valve area by pressure   half-time: 2.29 cm^2. Valve area by continuity equation (using   LVOT flow): 1.12 cm^2. - Left atrium: The atrium was normal in size. - Tricuspid valve: There was trivial regurgitation. - Pulmonary arteries: PA peak pressure: 18 mm Hg (S). - Inferior vena cava: The vessel was normal in size. The   respirophasic diameter changes were in the normal range (= 50%),   consistent with normal central venous pressure.  Impressions: - LVEF 50-55%, mild LVH, normal wall motion, mechanical MVR -   normal leaflet motion, no obstruction, normal LA size, trivial   TR, RVSP 18 mmHg, normal IVC. _____________   History of Present Illness     Benjamin Rodriguez is a very pleasant 32 year old male with past medical history for severe mitral regurgitation status post replacement with a mechanical Saint Jude valve at [facility] on November 12, 2014.  Per patient, prior to the surgery he had symptoms of dyspnea that completely resolved  post surgery.  He had been started on Coumadin and to follow for limited time with a cardiologist at [facility]. He has moved to [place] 2 years ago and states that for about the last year he has not been able to get prescription for Coumadin as he did not have PCP nor  cardiologist.  Despite that he has been doing well, has maintained a healthy weight and exercise daily. He did not notice any lower extremity edema, orthopnea, proximal nocturnal dyspnea. He haas had no dyspnea on exertion while working out.  The patient has noticed that for the last few weeks he has been having intermittent retrosternal chest pains that are not related to exertion. He was still able to exercise in the mornings. His pain feels like a squeeze, can be sharp, and worse with deep inspiration, not necessarily worse in horizontal position.  He came to the ER on 08/05/18 as his chest pain would not go away overnight.  It has completely resolved in the ED with ketorolac.  His family history is significant for diabetes but otherwise no CHF or premature coronary artery disease.  He drinks occasionally does not smoke and does not use any drugs.  He currently does not use any medications.  He had minimal troponin elevation and was admitted for further cardiac evaluation.  His noncompliance with Coumadin was worrisome for clotting with embolization to coronary artery.  Hospital Course     Consultants: None  The patient was first arranged for cardiac CTA to assess for any thrombus present on the mitral valve leaflets, however his second troponin was 13.49 and so he was scheduled for cardiac catheterization.  Troponins peaked at 15.91 and trended down.  Per Benjamin Rodriguez- Cardiac catheterization done yesterday on 08/06/2018 showed good mobility of 1 of the leaflets but limited mobility of the other that most probably represents mild thrombosis.  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 Suspect thromboembolic subtotal occlusion of distal-mid LAD with successful balloon angioplasty restoring TIMI 3 flow, but resulting in occlusion of ~15mm distal diagonal branch.  Ventriculogram shows  preserved LVEF with apical anterior hypokinesis.  He completed a short course of Aggrastat, and started on IV heparin. Warfarin was started last night, INR currently 1.09.  Echocardiogram shows akinesis and apical walls, overall LVEF 50-55%, mean gradient across mitral valve 4 mmHg.    Recommendation is for use of Coumadin, aspirin and clopidogrel for minimum of 1 months and then up to 1 year of clopidogrel alone along with warfarin, after that warfarin only.   We will continue low-dose metoprolol and high dose of atorvastatin.  He is hypertensive, I will start losartan 50 mg daily.  Pt has been maintained on heparin drip and coumadin initiated. Goal for INR will be 2.5-3.5. INR today is 1.09. Bridging with Lovenox has been arranged by pharmacy. Enoxaparin 1mg /kg (100mg ) SQ twice daily. The patient has experience with Lovenox. He will be discharged home on Warfarin 12mg  daily. He has an appointment to get established in our coumadin clinic on Tuesday 11/19.   Patient has been seen by Benjamin Rodriguez today and deemed ready for discharge home. All follow up appointments have been scheduled. Discharge medications are listed below. _____________  Discharge Vitals Blood pressure (!) 151/88, pulse 72, temperature 98.1 F (36.7 C), temperature source Oral, resp. rate 14, height 5\' 10"  (1.778 m), weight 96.8 kg, SpO2 96 %.  Filed Weights  08/05/18 1703 08/06/18 0450 08/07/18 0650  Weight: 95.8 kg 95.9 kg 96.8 kg    Labs & Radiologic Studies    CBC Recent Labs    08/05/18 0833 08/06/18 0404 08/07/18 0055  WBC 4.6 6.3 6.3  NEUTROABS 2.6  --   --   HGB 15.5 14.1 14.0  HCT 46.0 43.5 41.6  MCV 87.0 87.0 86.5  PLT 278 287 265   Basic Metabolic Panel Recent Labs    25/36/64 0404 08/07/18 0055  NA 139 138  K 3.7 3.5  CL 108 107  CO2 22 26  GLUCOSE 87 101*  BUN 12 7  CREATININE 1.06 1.09  CALCIUM 9.1 9.0   Liver Function Tests No results for input(s): AST, ALT, ALKPHOS, BILITOT, PROT,  ALBUMIN in the last 72 hours. No results for input(s): LIPASE, AMYLASE in the last 72 hours. Cardiac Enzymes Recent Labs    08/05/18 1707 08/05/18 2246 08/06/18 0404  TROPONINI 13.49* 15.91* 9.81*   BNP Invalid input(s): POCBNP D-Dimer No results for input(s): DDIMER in the last 72 hours. Hemoglobin A1C No results for input(s): HGBA1C in the last 72 hours. Fasting Lipid Panel Recent Labs    08/07/18 0055  CHOL 205*  HDL 36*  LDLCALC 135*  TRIG 168*  CHOLHDL 5.7   Thyroid Function Tests No results for input(s): TSH, T4TOTAL, T3FREE, THYROIDAB in the last 72 hours.  Invalid input(s): FREET3 _____________  Dg Chest 2 View  Result Date: 08/05/2018 CLINICAL DATA:  Chest pain EXAM: CHEST - 2 VIEW COMPARISON:  None. FINDINGS: Prior mitral valve repair. Mild cardiomegaly. Lungs clear. No effusions or acute bony abnormality. IMPRESSION: Mild cardiomegaly.  No active disease. Electronically Signed   By: Charlett Nose M.D.   On: 08/05/2018 08:48   Disposition   Pt is being discharged home today in good condition.  Follow-up Plans & Appointments    Follow-up Information    Dyann Kief, PA-C Follow up.   Specialty:  Cardiology Why:  Cardiology hospital follow-up on 08/13/2018 at 10:30 AM.  Please arrive 15 minutes early for check-in. Contact information: [address] [place] [address] [phone]        [facility] [address] Office Follow up.   Specialty:  Cardiology Why:  You have an appointment to get established with our [facility] coumadin clinic on 08/12/18 at 10:30 am. Your blood needs to be checked on this day to follow coumadin.  Contact information: [address] [place] [address] [phone]         Discharge Instructions    Amb Referral to Cardiac Rehabilitation   Complete by:  As directed    Diagnosis:   Coronary Stents NSTEMI     Diet - low sodium heart healthy   Complete by:  As  directed    Discharge instructions   Complete by:  As directed    PLEASE REMEMBER TO BRING ALL OF YOUR MEDICATIONS TO EACH OF YOUR FOLLOW-UP OFFICE VISITS.  PLEASE ATTEND ALL SCHEDULED FOLLOW-UP APPOINTMENTS.   Activity: Increase activity slowly as tolerated. You may shower, but no soaking baths (or swimming) for 1 week. No driving for 24 hours. No lifting over 5 lbs for 1 week. No sexual activity for 1 week.   You May Return to Work: To be determined at office follow up.   Wound Care: You may wash cath site gently with soap and water. Keep cath site clean and dry. If you notice pain,  swelling, bleeding or pus at your cath site, please call [phone].   Increase activity slowly   Complete by:  As directed       Discharge Medications   Allergies as of 08/07/2018   No Known Allergies     Medication List    STOP taking these medications   predniSONE 10 MG tablet Commonly known as:  DELTASONE     TAKE these medications   aspirin 81 MG EC tablet Take 1 tablet (81 mg total) by mouth daily. For one month only. Start taking on:  08/08/2018   atorvastatin 40 MG tablet Commonly known as:  LIPITOR Take 1 tablet (40 mg total) by mouth daily at 6 PM.   clopidogrel 75 MG tablet Commonly known as:  PLAVIX Take 1 tablet (75 mg total) by mouth daily with breakfast. Start taking on:  08/08/2018   enoxaparin 100 MG/ML injection Commonly known as:  LOVENOX Inject 1 mL (100 mg total) into the skin every 12 (twelve) hours for 7 days. Start taking on:  08/08/2018   losartan 50 MG tablet Commonly known as:  COZAAR Take 1 tablet (50 mg total) by mouth daily.   metoprolol tartrate 25 MG tablet Commonly known as:  LOPRESSOR Take 1 tablet (25 mg total) by mouth 2 (two) times daily.   nitroGLYCERIN 0.4 MG SL tablet Commonly known as:  NITROSTAT Place 1 tablet (0.4 mg total) under the tongue every 5 (five) minutes x 3 doses as needed for chest pain.   warfarin 6 MG tablet Commonly  known as:  COUMADIN Take 2 tablets (12 mg total) by mouth daily at 6 PM. What changed:    medication strength  when to take this  Another medication with the same name was removed. Continue taking this medication, and follow the directions you Rodriguez here.        Acute coronary syndrome (MI, NSTEMI, STEMI, etc) this admission?: Yes.     AHA/ACC Clinical Performance & Quality Measures: 1. Aspirin prescribed? - Yes 2. ADP Receptor Inhibitor (Plavix/Clopidogrel, Brilinta/Ticagrelor or Effient/Prasugrel) prescribed (includes medically managed patients)? - Yes 3. Beta Blocker prescribed? - Yes 4. High Intensity Statin (Lipitor 40-80mg  or Crestor 20-40mg ) prescribed? - Yes 5. EF assessed during THIS hospitalization? - Yes 6. For EF <40%, was ACEI/ARB prescribed? - Not Applicable (EF >/= 40%) 7. For EF <40%, Aldosterone Antagonist (Spironolactone or Eplerenone) prescribed? - Not Applicable (EF >/= 40%) 8. Cardiac Rehab Phase II ordered (Included Medically managed Patients)? - Yes     Outstanding Labs/Studies   INR via coumadin clinic BMet at follow up as pt started on ARB Lipid panel and LFTs in 6 weeks  Duration of Discharge Encounter   Greater than 30 minutes including physician time.  Signed, Berton BonJanine Akyla Vavrek, NP 08/07/2018, 1:09 PM

## 2018-08-07 NOTE — Progress Notes (Addendum)
Progress Note  Patient Name: Benjamin Rodriguez Date of Encounter: 08/07/2018  [facility]  Subjective   He feels well, no chest pain.  Inpatient Medications    Scheduled Meds: . aspirin EC  81 mg Oral Daily  . atorvastatin  40 mg Oral q1800  . clopidogrel  75 mg Oral Q breakfast  . metoprolol tartrate  25 mg Oral BID  . sodium chloride flush  3 mL Intravenous Q12H  . warfarin  15 mg Oral ONCE-1800  . Warfarin - Pharmacist Dosing Inpatient   Does not apply q1800   Continuous Infusions: . sodium chloride    . heparin 1,400 Units/hr (08/07/18 0812)   PRN Meds: sodium chloride, acetaminophen, nitroGLYCERIN, nitroGLYCERIN, ondansetron (ZOFRAN) IV, sodium chloride flush   Vital Signs    Vitals:   08/06/18 1845 08/06/18 2004 08/07/18 0650 08/07/18 0807  BP: (!) 166/93 (!) 151/100 (!) 153/82 (!) 160/97  Pulse:  66 71   Resp: (!) 21 17 20 18   Temp:  (!) 97.5 F (36.4 C) 97.8 F (36.6 C)   TempSrc:  Oral Oral   SpO2:  99% 97%   Weight:   96.8 kg   Height:        Intake/Output Summary (Last 24 hours) at 08/07/2018 1126 Last data filed at 08/07/2018 [id number] Gross per 24 hour  Intake 966.64 ml  Output 950 ml  Net 16.64 ml   Filed Weights   08/05/18 1703 08/06/18 0450 08/07/18 0650  Weight: 95.8 kg 95.9 kg 96.8 kg    Telemetry    NSR - Personally Reviewed  ECG    NSR, incomplete RBBB, nonspecific ST/TW abnormalties - Personally Reviewed  Physical Exam   GEN: No acute distress.   Neck: No JVD Cardiac: RRR, S1,2 click, no rubs, or gallops.  Respiratory: Clear to auscultation bilaterally. GI: Soft, nontender, non-distended  MS: No edema; No deformity. Neuro:  Nonfocal  Psych: Normal affect   Labs    Chemistry Recent Labs  Lab 08/05/18 0833 08/06/18 0404 08/07/18 0055  NA 139 139 138  K 4.1 3.7 3.5  CL 104 108 107  CO2 28 22 26   GLUCOSE 97 87 101*  BUN 11 12 7   CREATININE 1.04 1.06 1.09  CALCIUM 9.2 9.1 9.0  GFRNONAA >60 >60 >60    GFRAA >60 >60 >60  ANIONGAP 7 9 5      Hematology Recent Labs  Lab 08/05/18 0833 08/06/18 0404 08/07/18 0055  WBC 4.6 6.3 6.3  RBC 5.29 5.00 4.81  HGB 15.5 14.1 14.0  HCT 46.0 43.5 41.6  MCV 87.0 87.0 86.5  MCH 29.3 28.2 29.1  MCHC 33.7 32.4 33.7  RDW 12.8 12.9 12.8  PLT 278 287 265    Cardiac Enzymes Recent Labs  Lab 08/05/18 0833 08/05/18 1707 08/05/18 2246 08/06/18 0404  TROPONINI 0.09* 13.49* 15.91* 9.81*    Recent Labs  Lab 08/05/18 0839  TROPIPOC 0.07     BNPNo results for input(s): BNP, PROBNP in the last 168 hours.   DDimer No results for input(s): DDIMER in the last 168 hours.   Radiology    No results found.  Cardiac Studies   Echo:   - Left ventricle: The cavity size was normal. Wall thickness was   increased in a pattern of mild LVH. Systolic function was normal.   The estimated ejection fraction was in the range of 50% to 55%.   The study is not technically sufficient to allow evaluation of LV   diastolic  function. - Mitral valve: Mechanical MVR - normal leaflet motion. No   obstruction. Mean gradient (D): 4 mm Hg. Valve area by pressure   half-time: 2.29 cm^2. Valve area by continuity equation (using   LVOT flow): 1.12 cm^2. - Left atrium: The atrium was normal in size. - Tricuspid valve: There was trivial regurgitation. - Pulmonary arteries: PA peak pressure: 18 mm Hg (S). - Inferior vena cava: The vessel was normal in size. The   respirophasic diameter changes were in the normal range (= 50%),   consistent with normal central venous pressure.  Impressions:  - LVEF 50-55%, mild LVH, normal wall motion, mechanical MVR -   normal leaflet motion, no obstruction, normal LA size, trivial   TR, RVSP 18 mmHg, normal IVC.  Cath:  CULPRIT LESION: Mid LAD lesion is 95% stenosed with 75% stenosed side branch in Ost 2nd Diag.  Balloon angioplasty was performed using a BALLOON SAPPHIRE 2.0X12. 100% residual stenosis of small 2nd  Diag  Post intervention, there is a 0% residual stenosis in the LAD, WITH .  LV end diastolic pressure is moderately elevated.  The left ventricular systolic function is low-normal. The left ventricular ejection fraction is 50-55% by visual estimate.  There is no mitral valve regurgitation. Mechanical valve appears to have 1 of 2 tilting-disc's nonfunctional.   SUMMARY Suspect thromboembolic subtotal occlusion of distal-mid LAD with successful balloon angioplasty restoring TIMI 3 flow, but resulting in occlusion of ~48mm distal diagonal branch. Normal EF with apical anterior hypokinesis. Apparent mild single leaflet dysfunction of Tilting Disc mechanical Mitral Valve = likely source of thromboembolism.   RECOMMENDATIONS  Return to nursing unit for ongoing care and TR band removal.  With plaque shift of thrombus into the small distal diagonal branch, will run short course of Aggrastat (roughly 4 hours)  Restart IV heparin 8 hours after sheath removal.  Restart warfarin tonight  Recommend uninterrupted dual antiplatelet therapy with Aspirin 81mg  daily and Clopidogrel 75mg  daily for a minimum of 1 month DAPT, then 2 more months Clopidogrel alone (along with Warfarin).    Patient Profile     Benjamin Rodriguez is a 32 y.o. male w/ h/o severe mitral regurgitation, s/p mechanical mitral valve replacement at [facility] in 2016 and chronic anticoagulation therapy w/ Coumadin, presenting with chest pain for the last few weeks. INR was subtherapeutic on arrival at 1.09. Has ruled in for NSTEMI with troponin peaking at 15.91. Awaiting LHC.   Assessment & Plan    1. NSTEMI, embolic 2. Mechanical Mitral Valve thrombosis, noncompliance with coumadin  S/p cath with fluoroscopy yesterday that showed good mobility of 1 of the leaflets but limited mobility of the other that most probably represents mild thrombosis.  There is a focal distal LAD thrombus with no other evidence for coronary artery disease, this  is most probably embolization from mechanical valve thrombosis. Suspect thromboembolic subtotal occlusion of distal-mid LAD with successful balloon angioplasty restoring TIMI 3 flow, but resulting in occlusion of ~57mm distal diagonal branch.  Ventriculogram shows preserved LVEF with apical anterior hypokinesis.  He completed a short course of Aggrastat, and started on IV heparin.  Warfarin was started last night, INR currently 1.09.  Recommendation is for use of Coumadin, aspirin and clopidogrel for minimum of 1 months and then up to 1 year of clopidogrel alone along with warfarin, after that warfarin only.  Goal for INR will be 2.5-3.5.  We will continue low-dose metoprolol and high dose of atorvastatin.  He can potentially be discharged  today if we can get Lovenox shots for him for bridging.  We will arrange for follow-up in our clinic also establish him as a patient in our Coumadin clinic. Echocardiogram shows akinesis and apical walls, overall LVEF 50-55%, mean gradient across mitral valve 4 mmHg.  He is hypertensive, I will start losartan 50 mg daily.  Tobias Alexander, MD 08/07/2018

## 2018-08-07 NOTE — Progress Notes (Signed)
CARDIAC REHAB PHASE I   PRE:  Rate/Rhythm: 77 SR with PVCs  BP:  Sitting: 160/97      SaO2: 97 RA  MODE:  Ambulation: >1000 ft       105 peak Hr  POST:  Rate/Rhythm: 92 SR with PVCs  BP:  Sitting: 162/97    SaO2: 98 RA   Pt ambulated >104700ft in hallway independently with steady gait. Pt denied CP or SOB. Pt educated on importance of ASA, Plavix, and NTG. Pt given MI book, and heart healthy diet. Reviewed restrictions and exercise guidelines. Will refer to CRP II [place]. Will continue to follow.  [phone] Reynold Boweneresa  Betheny Suchecki, RN BSN 08/07/2018 8:53 AM

## 2018-08-07 NOTE — Progress Notes (Addendum)
ANTICOAGULATION CONSULT NOTE   Pharmacy Consult for Heparin/Coumadin Indication: h/o MVR  No Known Allergies  Patient Measurements: Height: 5\' 10"  (177.8 cm) Weight: 213 lb 6.5 oz (96.8 kg) IBW/kg (Calculated) : 73  Heparin Dosing Weight: 93 kg  Vital Signs: Temp: 98.1 F (36.7 C) (11/14 1213) Temp Source: Oral (11/14 1213) BP: 151/88 (11/14 1213) Pulse Rate: 72 (11/14 1213)  Labs: Recent Labs    08/05/18 0833 08/05/18 1201 08/05/18 1707 08/05/18 1907 08/05/18 2246 08/06/18 0404 08/06/18 0841 08/07/18 0055  HGB 15.5  --   --   --   --  14.1  --  14.0  HCT 46.0  --   --   --   --  43.5  --  41.6  PLT 278  --   --   --   --  287  --  265  APTT  --  33  --   --   --   --   --   --   LABPROT  --  14.0  --   --   --  14.4  --  13.7  INR  --  1.09  --   --   --  1.13  --  1.06  HEPARINUNFRC  --   --   --  0.16*  --   --  0.96* 0.47  CREATININE 1.04  --   --   --   --  1.06  --  1.09  TROPONINI 0.09*  --  13.49*  --  15.91* 9.81*  --   --     Estimated Creatinine Clearance: 112.5 mL/min (by C-G formula based on SCr of 1.09 mg/dL).  Assessment: 32 y/o Male with St Jude mechanical valve s/p angioplasty for anticoagulation.  Pt now to discharge home, INR subtherapeutic so will give dose of Lovenox prior to D/C and begin bridging tomorrow.  Goal of Therapy:  INR 2.5-3.5 Heparin level 0.3-0.7 units/ml Monitor platelets by anticoagulation protocol: Yes   Plan:  -Warfarin 12mg  daily -Recommend INR follow-up early next week (I.e. Monday or Tuesday) -Enoxaparin 1mg /kg (100mg ) SQ twice daily -Low-dose aspirin 81mg  x1 month and clopidogrel x3 months  Fredonia HighlandMichael Kaydense Rizo, PharmD, BCPS Clinical Pharmacist [phone] Please check AMION for all [facility] numbers 08/07/2018

## 2018-08-07 NOTE — Progress Notes (Signed)
Heparin stopped at 1245, per pharmacist

## 2018-08-08 ENCOUNTER — Telehealth (HOSPITAL_COMMUNITY): Payer: Self-pay

## 2018-08-08 NOTE — Telephone Encounter (Signed)
Pt's insurance is active and [facility]  No Co-pay, deductible amount of $0.00/$0.00, out of pocket amount $7,900/$0.00, no co-insurance, and no pre-authorization is required. [facility] Lurena [facility]Rebecca [facility] reference [id number]  Will make initial call to pt in regards to Cardiac Rehab. If interested, pt will need to complete follow up appt. Once completed, pt will be contacted for scheduling.

## 2018-08-08 NOTE — Telephone Encounter (Signed)
Made initial call to patient in regards to Cardiac Rehab - pt stated he is interested. Went over scheduling process and verified insurance with patient, he verbalized understanding. Will contact patient for scheduling once f/u appt has been completed.

## 2018-08-11 ENCOUNTER — Other Ambulatory Visit: Payer: Self-pay | Admitting: *Deleted

## 2018-08-11 ENCOUNTER — Encounter: Payer: Self-pay | Admitting: *Deleted

## 2018-08-11 NOTE — Patient Outreach (Signed)
[facility]) Care Management  08/11/2018  Benjamin Rodriguez 1985/11/26 [id number]   Subjective: Telephone call to patient's home / mobile number, spoke with patient, and HIPAA verified.  Discussed [facility] Care Management [facility] Transition of care follow up, patient voiced understanding, and is in agreement to follow up.   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  Discussed importance of hospital follow up with primary MD, patient voices understanding, states has not seen a primary MD since his primary [facility] in November of 2018, and is in agreement to receive information regarding how [employer] Employees can obtain primary care providers via email [facility](RNCM will forward patient email sent to all [employer] Employees on 11/05/17), states he will follow up as appropriate.   Patient states he is able to manage self care and has assistance as needed.  Patient voices understanding of medical diagnosis and treatment plan.   States he is accessing the following [employer] benefits: outpatient pharmacy, hospital indemnity (states he is in the process of filing claim, RNCM will email patient the contact information per his request to work email [email or web address], and will call [employer] to start family medical leave act (FMLA) process (RNCM will email patient the contact information per his request).   Discussed [employer] Employee Nutritional Counseling resources through [facility], patient voices understanding, and will follow up if appropriate.  RNCM will email patient contact information per his request for nutritional counseling resources.  Patient states he does not have any education material, transition of care, care coordination, disease management, disease monitoring, transportation,  community resource, or pharmacy needs at this time.  States he is very appreciative of the follow up and is in agreement to receive [facility] Care Management information.      Objective:  Per KPN (Knowledge Performance Now, point of care tool) and chart review, patient hospitalized 08/05/18 - 08/07/18 for Non-ST elevation (NSTEMI) myocardial infarction, Unstable angina.   Patient also has a history of hypertension, Anticoagulant long-term use, and mitral valve replacement with mechanical valve.       Assessment: Received [facility] Transition of care referral on 08/06/18.  Transition of care follow up completed, no care management needs, and will proceed with case closure.       Plan: RNCM will send patient successful outreach letter, [facility] pamphlet, and magnet. RNCM will complete case closure due to follow up completed / no care management needs.  RNCM will send patient requested contact numbers via email per his request, to  his secure work email.        Adella Manolis H. Gardiner Barefootooper RN, BSN, CCM [facility] Care Management [facility] Telephonic CM Phone: [phone] Fax: [phone]

## 2018-08-12 ENCOUNTER — Encounter: Payer: Self-pay | Admitting: Physician Assistant

## 2018-08-12 ENCOUNTER — Ambulatory Visit (INDEPENDENT_AMBULATORY_CARE_PROVIDER_SITE_OTHER): Payer: 59 | Admitting: *Deleted

## 2018-08-12 DIAGNOSIS — Z5181 Encounter for therapeutic drug level monitoring: Secondary | ICD-10-CM

## 2018-08-12 DIAGNOSIS — Z952 Presence of prosthetic heart valve: Secondary | ICD-10-CM | POA: Diagnosis not present

## 2018-08-12 LAB — POCT INR: INR: 2 (ref 2.0–3.0)

## 2018-08-12 NOTE — Progress Notes (Addendum)
Cardiology Office Note    Date:  08/13/2018   ID:  Benjamin Rodriguez, DOB [birth year], MRN [id number]  PCP:  Patient, No Pcp Per  Cardiologist: Tobias Alexander, MD EPS: None  No chief complaint on file.   History of Present Illness:  Benjamin Rodriguez is a 32 y.o. 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  Suspect thromboembolic sub-total occlusion of the distal mid LAD with successful balloon angioplasty restoring TIMI-3 flow.  This resulted in occlusion of a 1 mm distal diagonal branch.  LVEF with apical anterior hypokinesis.  He was restarted on Coumadin.  2D echo showed akinesis of the apical walls overall LVEF 40 to 55% mean gradient across the mitral valve 4 mmHg.  Plan is for Plavix, aspirin and Coumadin for a minimum of 1 month and then another year of Plavix and Coumadin and after that Coumadin alone.  Patient here for post hospital f/u. A little short of breath with going up stairs or walking long distance. Also sharp pain and food and pills getting stuck when he swallows but that's getting better. No chest pain.  Complains of excessive sweating at night.  Going to do cardiac rehab in January.  Works for [employer].   Past Medical History:  Diagnosis Date  . CAD (coronary artery disease)    a. 08/06/18- LHC- mechanical valve thromboembolic subtotal occlusion of distal-mid LAD with successful balloon angioplasty   . History of prosthetic mitral valve 2016  . Hypertension     Past Surgical History:  Procedure Laterality Date  . CARDIAC SURGERY    . CORONARY BALLOON ANGIOPLASTY N/A 08/06/2018   Procedure: CORONARY BALLOON ANGIOPLASTY;  Surgeon: Marykay Lex, MD;  Location: [facility] INVASIVE CV LAB;  Service: Cardiovascular;  Laterality: N/A;  . LEFT HEART CATH AND CORONARY ANGIOGRAPHY N/A  08/06/2018   Procedure: LEFT HEART CATH AND CORONARY ANGIOGRAPHY;  Surgeon: Marykay Lex, MD;  Location: [facility] INVASIVE CV LAB;  Service: Cardiovascular;  Laterality: N/A;  . MITRAL VALVE REPLACEMENT      Current Medications: Current Meds  Medication Sig  . aspirin EC 81 MG EC tablet Take 1 tablet (81 mg total) by mouth daily. For one month only.  Marland Kitchen atorvastatin (LIPITOR) 40 MG tablet Take 1 tablet (40 mg total) by mouth daily at 6 PM.  . clopidogrel (PLAVIX) 75 MG tablet Take 1 tablet (75 mg total) by mouth daily with breakfast.  . losartan (COZAAR) 50 MG tablet Take 1 tablet (50 mg total) by mouth daily.  . metoprolol tartrate (LOPRESSOR) 25 MG tablet Take 1 tablet (25 mg total) by mouth 2 (two) times daily.  . nitroGLYCERIN (NITROSTAT) 0.4 MG SL tablet Place 1 tablet (0.4 mg total) under the tongue every 5 (five) minutes x 3 doses as needed for chest pain.  Marland Kitchen warfarin (COUMADIN) 6 MG tablet Take 2 tablets (12 mg total) by mouth daily at 6 PM.     Allergies:   Patient has no known allergies.   Social History   Socioeconomic History  . Marital status: Single    Spouse name: Not on file  . Number of children: Not on file  . Years of education: Not on file  . Highest education level: Not on file  Occupational History  . Occupation: works at [employer] in the prior [employer]  Social Needs  . Financial resource strain: Not on file  . Food insecurity:    Worry: Not on file    Inability: Not on file  . Transportation needs:    Medical: Not on file    Non-medical: Not on file  Tobacco Use  . Smoking status: Never Smoker  . Smokeless tobacco: Never Used  Substance and Sexual Activity  . Alcohol use: Yes    Frequency: Never  . Drug use: Never  . Sexual activity: Not on file  Lifestyle  . Physical activity:    Days per week: Not on file    Minutes per session: Not on file  . Stress: Not on file  Relationships  . Social connections:    Talks on phone: Not on file    Gets  together: Not on file    Attends religious service: Not on file    Active member of club or organization: Not on file    Attends meetings of clubs or organizations: Not on file    Relationship status: Not on file  Other Topics Concern  . Not on file  Social History Narrative  . Not on file     Family History:  The patient's family history includes Aneurysm in his maternal grandmother; Breast cancer in his maternal grandmother; Heart attack in his father; Hypertension in his father.   ROS:   Please see the history of present illness.    Review of Systems  Constitution: Negative.  HENT: Negative.   Cardiovascular: Negative.   Respiratory: Negative.   Endocrine: Negative.   Hematologic/Lymphatic: Negative.   Musculoskeletal: Negative.   Gastrointestinal: Positive for dysphagia.  Genitourinary: Negative.   Neurological: Negative.    All other systems reviewed and are negative.   PHYSICAL EXAM:   VS:  BP (!) 104/50   Pulse 73   Ht 5\' 10"  (1.778 m)   Wt 137 lb 6.4 oz (62.3 kg)   SpO2 97%   BMI 19.71 kg/m   Physical Exam  GEN: Well nourished, well developed, in no acute distress  Neck: no JVD, carotid bruits, or masses Cardiac:RRR; crisp valvular click at the apex Respiratory:  clear to auscultation bilaterally, normal work of breathing GI: soft, nontender, nondistended, + BS Ext: Right arm without hematoma or hemorrhage at cath site, good radial brachial pulses, lower extremities without cyanosis, clubbing, or edema, Good distal pulses bilaterally Neuro:  Alert and Oriented x 3,  Psych: euthymic mood, full affect  Wt Readings from Last 3 Encounters:  08/13/18 137 lb 6.4 oz (62.3 kg)  08/07/18 213 lb 6.5 oz (96.8 kg)  01/29/16 220 lb (99.8 kg)      Studies/Labs Reviewed:   EKG:  EKG is not ordered today.   Recent Labs: 08/07/2018: BUN 7; Creatinine, Ser 1.09; Hemoglobin 14.0; Platelets 265; Potassium 3.5; Sodium 138   Lipid Panel    Component Value Date/Time    CHOL 205 (H) 08/07/2018 0055   TRIG 168 (H) 08/07/2018 0055   HDL 36 (L) 08/07/2018 0055   CHOLHDL 5.7 08/07/2018 0055   VLDL 34 08/07/2018 0055   LDLCALC 135 (H) 08/07/2018 0055    Additional studies/ records that were reviewed today include:  Echocardiogram 08/06/2018 Study Conclusions - Left ventricle: The cavity size was normal. Wall thickness was   increased in a pattern of mild LVH. Systolic function was normal.   The estimated ejection fraction was in the range of 50% to 55%.   The study is not technically sufficient to  allow evaluation of LV   diastolic function. - Mitral valve: Mechanical MVR - normal leaflet motion. No   obstruction. Mean gradient (D): 4 mm Hg. Valve area by pressure   half-time: 2.29 cm^2. Valve area by continuity equation (using   LVOT flow): 1.12 cm^2. - Left atrium: The atrium was normal in size. - Tricuspid valve: There was trivial regurgitation. - Pulmonary arteries: PA peak pressure: 18 mm Hg (S). - Inferior vena cava: The vessel was normal in size. The   respirophasic diameter changes were in the normal range (= 50%),   consistent with normal central venous pressure.   Impressions: - LVEF 50-55%, mild LVH, normal wall motion, mechanical MVR -   normal leaflet motion, no obstruction, normal LA size, trivial   TR, RVSP 18 mmHg, normal IVC.  _____________     ASSESSMENT:    1. Non-ST elevation (NSTEMI) myocardial infarction (HCC)   2. H/O mitral valve replacement with mechanical valve   3. Dysphagia, unspecified type   4. Hyperlipidemia, unspecified hyperlipidemia type      PLAN:  In order of problems listed above:  NSTEMI suspect thromboembolic with subtotal occlusion of the distal to mid LAD with successful balloon angioplasty but resulting in occlusion of a 1 mm distal diagonal branch.  Apparent mild single leaflet dysfunction of tilting-disc mechanical mitral valve which is likely source of thromboembolism.  He was treated with  Aggrastat for about 4 hours followed by IV heparin and Coumadin.  Recommend aspirin 81 mg, Plavix 75 mg for a minimum of 1 month then 2 more months of Plavix along with warfarin but possibly up to a year.  May return to work after 6 weeks.  Follow-up with Dr. Delton See in 2 months.  Saint Jude MVR at [facility] 2016  Dysphasia with food and medicine improving but still has this.  Refer to GI.  Hyperlipidemia on Lipitor.  Check fasting lipid panel and LFTs in 2 months.  Medication Adjustments/Labs and Tests Ordered: Current medicines are reviewed at length with the patient today.  Concerns regarding medicines are outlined above.  Medication changes, Labs and Tests ordered today are listed in the Patient Instructions below. Patient Instructions  Medication Instructions:  Your physician recommends that you continue on your current medications as directed. Please refer to the Current Medication list given to you today.  If you need a refill on your cardiac medications before your next appointment, please call your pharmacy.   Lab work: None Ordered  If you have labs (blood work) drawn today and your tests are completely normal, you will receive your results only by: Marland Kitchen MyChart Message (if you have MyChart) OR . A paper copy in the mail If you have any lab test that is abnormal or we need to change your treatment, we will call you to review the results.  Testing/Procedures: None ordered  Follow-Up: Follow up with Dr. Delton See on 10/29/18 at 4:20 PM.   Any Other Special Instructions Will Be Listed Below (If Applicable).       Elson Clan, PA-C  08/13/2018 10:59 AM    [facility] [address] [place], [place], [place]  [address] Phone: [phone]; Fax: [phone]

## 2018-08-12 NOTE — Patient Instructions (Addendum)
A full discussion of the nature of anticoagulants has been carried out.  A benefit risk analysis has been presented to the patient, so that they understand the justification for choosing anticoagulation at this time. The need for frequent and regular monitoring, precise dosage adjustment and compliance is stressed.  Side effects of potential bleeding are discussed.  The patient should avoid any OTC items containing aspirin or ibuprofen, and should avoid great swings in general diet.  Avoid alcohol consumption.  Call if any signs of abnormal bleeding. Description   Continue Lovenox until completed. Start taking Coumadin 2 tablets daily and 1 tablet on Monday, Wednesday, and Fridays. Recheck INR in 1 week. Coumadin Clinic [phone] Main [phone]

## 2018-08-13 ENCOUNTER — Ambulatory Visit: Payer: 59 | Admitting: Physician Assistant

## 2018-08-13 ENCOUNTER — Encounter: Payer: Self-pay | Admitting: Physician Assistant

## 2018-08-13 VITALS — BP 104/50 | HR 73 | Ht 70.0 in | Wt 214.0 lb

## 2018-08-13 DIAGNOSIS — E785 Hyperlipidemia, unspecified: Secondary | ICD-10-CM

## 2018-08-13 DIAGNOSIS — Z952 Presence of prosthetic heart valve: Secondary | ICD-10-CM

## 2018-08-13 DIAGNOSIS — R1310 Dysphagia, unspecified: Secondary | ICD-10-CM

## 2018-08-13 DIAGNOSIS — I214 Non-ST elevation (NSTEMI) myocardial infarction: Secondary | ICD-10-CM | POA: Diagnosis not present

## 2018-08-13 NOTE — Patient Instructions (Addendum)
Medication Instructions:  Your physician recommends that you continue on your current medications as directed. Please refer to the Current Medication list given to you today.  If you need a refill on your cardiac medications before your next appointment, please call your pharmacy.   Lab work: Your physician recommends that you return for a FASTING lipid profile and liver function panel in 2 months   If you have labs (blood work) drawn today and your tests are completely normal, you will receive your results only by: Marland Kitchen. MyChart Message (if you have MyChart) OR . A paper copy in the mail If you have any lab test that is abnormal or we need to change your treatment, we will call you to review the results.  Testing/Procedures: None ordered  Follow-Up: You have been referred to Gastroenterology for Dysphagia   Follow up with Dr. Delton SeeNelson on 10/29/18 at 4:20 PM.   Any Other Special Instructions Will Be Listed Below (If Applicable).

## 2018-08-14 ENCOUNTER — Telehealth (HOSPITAL_COMMUNITY): Payer: Self-pay

## 2018-08-14 NOTE — Telephone Encounter (Signed)
Attempted to contact patient in regards to Cardiac Rehab - lm on vm °

## 2018-08-19 ENCOUNTER — Ambulatory Visit (INDEPENDENT_AMBULATORY_CARE_PROVIDER_SITE_OTHER): Payer: 59 | Admitting: *Deleted

## 2018-08-19 DIAGNOSIS — Z952 Presence of prosthetic heart valve: Secondary | ICD-10-CM

## 2018-08-19 DIAGNOSIS — Z5181 Encounter for therapeutic drug level monitoring: Secondary | ICD-10-CM

## 2018-08-19 LAB — POCT INR: INR: 3.5 — AB (ref 2.0–3.0)

## 2018-08-19 NOTE — Patient Instructions (Signed)
Description   Today take 1.5 tablets, then Continue taking Coumadin 2 tablets daily and 1 tablet on Monday, Wednesday, and Fridays. Recheck INR in 1 week. Coumadin Clinic [phone] Main [phone]

## 2018-08-23 DIAGNOSIS — Z952 Presence of prosthetic heart valve: Secondary | ICD-10-CM | POA: Diagnosis not present

## 2018-08-23 DIAGNOSIS — I10 Essential (primary) hypertension: Secondary | ICD-10-CM | POA: Diagnosis not present

## 2018-08-23 DIAGNOSIS — M272 Inflammatory conditions of jaws: Secondary | ICD-10-CM | POA: Diagnosis not present

## 2018-08-23 DIAGNOSIS — Z7901 Long term (current) use of anticoagulants: Secondary | ICD-10-CM | POA: Diagnosis not present

## 2018-08-23 DIAGNOSIS — Z79899 Other long term (current) drug therapy: Secondary | ICD-10-CM | POA: Diagnosis not present

## 2018-08-27 ENCOUNTER — Ambulatory Visit (INDEPENDENT_AMBULATORY_CARE_PROVIDER_SITE_OTHER): Payer: 59 | Admitting: *Deleted

## 2018-08-27 DIAGNOSIS — Z5181 Encounter for therapeutic drug level monitoring: Secondary | ICD-10-CM | POA: Diagnosis not present

## 2018-08-27 DIAGNOSIS — Z952 Presence of prosthetic heart valve: Secondary | ICD-10-CM | POA: Diagnosis not present

## 2018-08-27 LAB — POCT INR: INR: 2 (ref 2.0–3.0)

## 2018-08-27 NOTE — Patient Instructions (Signed)
Description   Start taking 1.5 tablets everyday except 2 tablets on Wednesdays and Saturdays.  Recheck INR in 1 week. Coumadin Clinic [phone] Main [phone]

## 2018-08-29 ENCOUNTER — Telehealth (HOSPITAL_COMMUNITY): Payer: Self-pay

## 2018-08-29 ENCOUNTER — Encounter (HOSPITAL_COMMUNITY): Payer: Self-pay

## 2018-08-29 NOTE — Telephone Encounter (Signed)
Attempted to call pt a 2nd time- Unable to leave voicemail  Mailed letter out

## 2018-09-03 ENCOUNTER — Ambulatory Visit (INDEPENDENT_AMBULATORY_CARE_PROVIDER_SITE_OTHER): Payer: 59 | Admitting: Pharmacist

## 2018-09-03 DIAGNOSIS — Z952 Presence of prosthetic heart valve: Secondary | ICD-10-CM | POA: Diagnosis not present

## 2018-09-03 DIAGNOSIS — Z5181 Encounter for therapeutic drug level monitoring: Secondary | ICD-10-CM

## 2018-09-03 LAB — POCT INR: INR: 3.5 — AB (ref 2.0–3.0)

## 2018-09-03 NOTE — Patient Instructions (Signed)
Continue taking 1.5 tablets everyday except 2 tablets on Wednesdays and Saturdays.  Recheck INR in 1 week. Coumadin Clinic [phone] Main [phone]

## 2018-09-09 NOTE — Telephone Encounter (Signed)
3rd attempt to call patient in regards to Cardiac Rehab - lm on vm °

## 2018-09-10 ENCOUNTER — Ambulatory Visit (INDEPENDENT_AMBULATORY_CARE_PROVIDER_SITE_OTHER): Payer: 59 | Admitting: *Deleted

## 2018-09-10 DIAGNOSIS — Z5181 Encounter for therapeutic drug level monitoring: Secondary | ICD-10-CM

## 2018-09-10 DIAGNOSIS — Z952 Presence of prosthetic heart valve: Secondary | ICD-10-CM | POA: Diagnosis not present

## 2018-09-10 LAB — POCT INR: INR: 3.5 — AB (ref 2.0–3.0)

## 2018-09-10 NOTE — Patient Instructions (Signed)
Description   Continue taking 1.5 tablets everyday except 2 tablets on Wednesdays and Saturdays.  Recheck INR in 1 week. Coumadin Clinic [phone] Main [phone]

## 2018-09-15 MED FILL — WARFARIN SODIUM 6 MG TABLET: 6 | 30 days supply | Qty: 60 | Fill #1

## 2018-09-16 ENCOUNTER — Encounter: Payer: Self-pay | Admitting: Physician Assistant

## 2018-09-19 ENCOUNTER — Ambulatory Visit (INDEPENDENT_AMBULATORY_CARE_PROVIDER_SITE_OTHER): Payer: 59 | Admitting: *Deleted

## 2018-09-19 DIAGNOSIS — Z952 Presence of prosthetic heart valve: Secondary | ICD-10-CM

## 2018-09-19 DIAGNOSIS — Z5181 Encounter for therapeutic drug level monitoring: Secondary | ICD-10-CM

## 2018-09-19 LAB — POCT INR: INR: 4.1 — AB (ref 2.0–3.0)

## 2018-09-19 NOTE — Patient Instructions (Addendum)
  Description   Today take 1/2 tablet, then change your dose to 1.5 tablets everyday except 2 tablets on Wednesdays.  Recheck INR in 11 days.  Coumadin Clinic [phone] Main [phone]

## 2018-09-23 NOTE — Telephone Encounter (Signed)
No response from pt.  Closed referral  

## 2018-09-30 ENCOUNTER — Telehealth (HOSPITAL_COMMUNITY): Payer: Self-pay

## 2018-09-30 NOTE — Telephone Encounter (Signed)
Pt called and stated he is ready to start CR, pt will come in for orientation on 11/06/2018 @ 730AM and will attend the 245PM class.  Mailed homework package.

## 2018-10-06 ENCOUNTER — Ambulatory Visit (INDEPENDENT_AMBULATORY_CARE_PROVIDER_SITE_OTHER): Payer: 59 | Admitting: *Deleted

## 2018-10-06 DIAGNOSIS — Z5181 Encounter for therapeutic drug level monitoring: Secondary | ICD-10-CM

## 2018-10-06 DIAGNOSIS — Z952 Presence of prosthetic heart valve: Secondary | ICD-10-CM

## 2018-10-06 LAB — POCT INR: INR: 1.3 — AB (ref 2.0–3.0)

## 2018-10-06 NOTE — Patient Instructions (Addendum)
Description   Today and tomorrow take 2 tablets then continue taking 1.5 tablets everyday except 2 tablets on Wednesdays.  Recheck INR in 1 week.  Coumadin Clinic [phone] Main [phone]

## 2018-10-10 ENCOUNTER — Telehealth (HOSPITAL_COMMUNITY): Payer: Self-pay

## 2018-10-13 ENCOUNTER — Other Ambulatory Visit: Payer: 59 | Admitting: *Deleted

## 2018-10-13 ENCOUNTER — Ambulatory Visit (INDEPENDENT_AMBULATORY_CARE_PROVIDER_SITE_OTHER): Payer: 59 | Admitting: *Deleted

## 2018-10-13 DIAGNOSIS — I214 Non-ST elevation (NSTEMI) myocardial infarction: Secondary | ICD-10-CM

## 2018-10-13 DIAGNOSIS — Z5181 Encounter for therapeutic drug level monitoring: Secondary | ICD-10-CM | POA: Diagnosis not present

## 2018-10-13 DIAGNOSIS — Z952 Presence of prosthetic heart valve: Secondary | ICD-10-CM | POA: Diagnosis not present

## 2018-10-13 DIAGNOSIS — E785 Hyperlipidemia, unspecified: Secondary | ICD-10-CM | POA: Diagnosis not present

## 2018-10-13 LAB — LIPID PANEL
CHOLESTEROL TOTAL: 225 mg/dL — AB (ref 100–199)
Chol/HDL Ratio: 6.1 ratio — ABNORMAL HIGH (ref 0.0–5.0)
HDL: 37 mg/dL — ABNORMAL LOW (ref >39)
LDL CALC: 161 mg/dL — AB (ref 0–99)
TRIGLYCERIDES: 137 mg/dL (ref 0–149)
VLDL CHOLESTEROL CAL: 27 mg/dL (ref 5–40)

## 2018-10-13 LAB — HEPATIC FUNCTION PANEL
ALBUMIN: 4.9 g/dL (ref 4.0–5.0)
ALK PHOS: 84 IU/L (ref 39–117)
ALT: 35 IU/L (ref 0–44)
AST: 28 IU/L (ref 0–40)
Bilirubin Total: 0.7 mg/dL (ref 0.0–1.2)
Bilirubin, Direct: 0.17 mg/dL (ref 0.00–0.40)
Total Protein: 7.5 g/dL (ref 6.0–8.5)

## 2018-10-13 LAB — POCT INR: INR: 1.7 — AB (ref 2.0–3.0)

## 2018-10-13 NOTE — Patient Instructions (Addendum)
Description   Start taking 1.5 tablets daily except 2 tablets on Mondays and Thursdays.   Recheck INR in 10 days.   Coumadin Clinic [phone] Main [phone]

## 2018-10-14 ENCOUNTER — Telehealth: Payer: Self-pay

## 2018-10-14 DIAGNOSIS — E785 Hyperlipidemia, unspecified: Secondary | ICD-10-CM

## 2018-10-14 MED ORDER — ATORVASTATIN CALCIUM 80 MG PO TABS: 80.0000 mg | ORAL_TABLET | Freq: Every day | ORAL | 3 refills | Status: DC

## 2018-10-14 MED FILL — ATORVASTATIN 80 MG TABLET: 80 | 90 days supply | Qty: 90 | Fill #0

## 2018-10-14 NOTE — Telephone Encounter (Signed)
Called and made patient aware of lab results. Patient states that he has been taking atorvastatin 40 mg QD. Made patient aware that we will increase atorvastatin to 80 mg QD and repeat fasting labs in 3 months. Patient verbalized understanding. Rx sent to preferred pharmacy and lab appointment made for 4/22.

## 2018-10-14 NOTE — Telephone Encounter (Signed)
-----   Message from Rosalio Macadamia, NP sent at 10/13/2018  4:39 PM EST ----- Reviewed for [name], PA Lipids not as good - ?taking Lipitor as directed? If so, increase to 80 mg a day. Repeat lipids and LFTs in 3 months (please order under [place]) If not, please let me know why.

## 2018-10-16 ENCOUNTER — Telehealth (HOSPITAL_COMMUNITY): Payer: Self-pay

## 2018-10-16 NOTE — Telephone Encounter (Signed)
Pt insurance is active and benefits verified through [facility]. Co-pay $0.00, DED $300.00/$33.22 met, out of pocket $7,900.00/$33.22 met, co-insurance 20%.  pre-authorization required. Liana/[facility], 10/16/2018 @ 848AM, REF# [id number]

## 2018-10-23 ENCOUNTER — Ambulatory Visit (INDEPENDENT_AMBULATORY_CARE_PROVIDER_SITE_OTHER): Payer: 59 | Admitting: *Deleted

## 2018-10-23 DIAGNOSIS — Z5181 Encounter for therapeutic drug level monitoring: Secondary | ICD-10-CM | POA: Diagnosis not present

## 2018-10-23 DIAGNOSIS — Z952 Presence of prosthetic heart valve: Secondary | ICD-10-CM | POA: Diagnosis not present

## 2018-10-23 LAB — POCT INR: INR: 1.6 — AB (ref 2.0–3.0)

## 2018-10-23 MED ORDER — WARFARIN SODIUM 6 MG PO TABS: ORAL_TABLET | ORAL | 2 refills | Status: DC

## 2018-10-23 MED FILL — WARFARIN SODIUM 6 MG TABLET: 6 | 30 days supply | Qty: 60 | Fill #0

## 2018-10-23 NOTE — Patient Instructions (Addendum)
Description   Today take 2.5 tablets and tomorrow take 2 tablets then start taking 2 tablets daily except 1.5tablets on Tuesday, Thursday, and Saturday.   Recheck INR in 1 week.   Coumadin Clinic [phone] Main [phone]

## 2018-10-29 ENCOUNTER — Ambulatory Visit (INDEPENDENT_AMBULATORY_CARE_PROVIDER_SITE_OTHER): Payer: 59 | Admitting: Cardiology

## 2018-10-29 ENCOUNTER — Ambulatory Visit: Payer: 59 | Admitting: Cardiology

## 2018-10-29 ENCOUNTER — Ambulatory Visit (INDEPENDENT_AMBULATORY_CARE_PROVIDER_SITE_OTHER): Payer: 59 | Admitting: *Deleted

## 2018-10-29 ENCOUNTER — Encounter: Payer: Self-pay | Admitting: Cardiology

## 2018-10-29 VITALS — BP 124/78 | HR 73 | Ht 70.0 in | Wt 220.2 lb

## 2018-10-29 DIAGNOSIS — Z5181 Encounter for therapeutic drug level monitoring: Secondary | ICD-10-CM

## 2018-10-29 DIAGNOSIS — E785 Hyperlipidemia, unspecified: Secondary | ICD-10-CM | POA: Diagnosis not present

## 2018-10-29 DIAGNOSIS — Z952 Presence of prosthetic heart valve: Secondary | ICD-10-CM | POA: Diagnosis not present

## 2018-10-29 DIAGNOSIS — I214 Non-ST elevation (NSTEMI) myocardial infarction: Secondary | ICD-10-CM | POA: Diagnosis not present

## 2018-10-29 LAB — POCT INR: INR: 3.6 — AB (ref 2.0–3.0)

## 2018-10-29 NOTE — Patient Instructions (Signed)
Medication Instructions:   Your physician recommends that you continue on your current medications as directed. Please refer to the Current Medication list given to you today.  If you need a refill on your cardiac medications before your next appointment, please call your pharmacy.     Lab work:  IN ONE MONTH TO CHECK--CMET AND LIPIDS--PLEASE COME FASTING TO THIS LAB APPOINTMENT  If you have labs (blood work) drawn today and your tests are completely normal, you will receive your results only by: Marland Kitchen MyChart Message (if you have MyChart) OR . A paper copy in the mail If you have any lab test that is abnormal or we need to change your treatment, we will call you to review the results.    Follow-Up: At [facility], you and your health needs are our priority.  As part of our continuing mission to provide you with exceptional heart care, we have created designated Provider Care Teams.  These Care Teams include your primary Cardiologist (physician) and Advanced Practice Providers (APPs -  Physician Assistants and Nurse Practitioners) who all work together to provide you with the care you need, when you need it. You will need a follow up appointment in 6 months.  Please call our office 2 months in advance to schedule this appointment.  You may see Tobias Alexander, MD or one of the following Advanced Practice Providers on your designated Care Team:   Von[place], PA-C Ronie Spies, PA-C . [name], PA-C

## 2018-10-29 NOTE — Patient Instructions (Signed)
Description   Today take 1 tablet then continue taking 2 tablets daily except 1.5 tablets on Tuesday, Thursday, and Saturday. Recheck INR in 2 weeks.  Coumadin Clinic [phone] Main [phone]

## 2018-10-29 NOTE — Progress Notes (Signed)
Cardiology Office Note    Date:  10/29/2018   ID:  Benjamin Rodriguez, DOB 12/29/1985, MRN [id number]  PCP:  Patient, No Pcp Per  Cardiologist: Tobias Alexander, MD EPS: None  Reason for visit: 3 months follow-up  History of Present Illness:  Benjamin Rodriguez is a 33 y.o. male with history of severe MR S/P [facility] MVR at [facility] 2016.Stopped his Coumadin 2 months ago when he moved to [place].  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  Suspect thromboembolic sub-total occlusion of the distal mid LAD with successful balloon angioplasty restoring TIMI-3 flow.  This resulted in occlusion of a 1 mm distal diagonal branch.  LVEF with apical anterior hypokinesis.  He was restarted on Coumadin.  2D echo showed akinesis of the apical walls overall LVEF 40 to 55% mean gradient across the mitral valve 4 mmHg.  Plan is for Plavix, aspirin and Coumadin for a minimum of 1 month and then another year of Plavix and Coumadin and after that Coumadin alone.  08/13/18 - the patient here for post hospital f/u. A little short of breath with going up stairs or walking long distance. Also sharp pain and food and pills getting stuck when he swallows but that's getting better. No chest pain.  Complains of excessive sweating at night.  Going to do cardiac rehab in January.  Works for [employer].  10/29/2018 -the patient is coming after 3 months, he has been feeling great, he denies any chest pain shortness of breath dizziness or syncope.  He has been compliant with his medications and has no side effects such as bleeding or muscle pain.  He used to go to gym every morning and currently is unsure how much he can exercise and as a result has gained 30 pounds.  He would like to know what exercise regimen he can do and if there are any supplements he can use for weight loss.   Past Medical History:  Diagnosis Date  . CAD (coronary artery disease)    a. 08/06/18- LHC-  mechanical valve thromboembolic subtotal occlusion of distal-mid LAD with successful balloon angioplasty   . History of prosthetic mitral valve 2016  . Hypertension     Past Surgical History:  Procedure Laterality Date  . CARDIAC SURGERY    . CORONARY BALLOON ANGIOPLASTY N/A 08/06/2018   Procedure: CORONARY BALLOON ANGIOPLASTY;  Surgeon: Marykay Lex, MD;  Location: [facility] INVASIVE CV LAB;  Service: Cardiovascular;  Laterality: N/A;  . LEFT HEART CATH AND CORONARY ANGIOGRAPHY N/A 08/06/2018   Procedure: LEFT HEART CATH AND CORONARY ANGIOGRAPHY;  Surgeon: Marykay Lex, MD;  Location: [facility] INVASIVE CV LAB;  Service: Cardiovascular;  Laterality: N/A;  . MITRAL VALVE REPLACEMENT      Current Medications: Current Meds  Medication Sig  . aspirin EC 81 MG EC tablet Take 1 tablet (81 mg total) by mouth daily. For one month only.  Marland Kitchen atorvastatin (LIPITOR) 80 MG tablet Take 1 tablet (80 mg total) by mouth daily.  . clopidogrel (PLAVIX) 75 MG tablet Take 1 tablet (75 mg total) by mouth daily with breakfast.  . losartan (COZAAR) 50 MG tablet Take 1 tablet (50 mg total) by mouth daily.  . metoprolol tartrate (LOPRESSOR) 25 MG tablet Take 1 tablet (25 mg total) by mouth 2 (two) times daily.  . nitroGLYCERIN (NITROSTAT) 0.4 MG SL tablet Place 1 tablet (0.4 mg total) under the tongue every 5 (five) minutes x 3 doses as  needed for chest pain.  Marland Kitchen. warfarin (COUMADIN) 6 MG tablet Take 2 tabs daily except 1.5 tabs on Tuesday, Thursday, and Saturday or as directed by the Coumadin Clinic.     Allergies:   Patient has no known allergies.   Social History   Socioeconomic History  . Marital status: Single    Spouse name: Not on file  . Number of children: Not on file  . Years of education: Not on file  . Highest education level: Not on file  Occupational History  . Occupation: works at [employer] in the prior [place] department  Social Needs  . Financial resource strain: Not on file  . Food insecurity:     Worry: Not on file    Inability: Not on file  . Transportation needs:    Medical: Not on file    Non-medical: Not on file  Tobacco Use  . Smoking status: Never Smoker  . Smokeless tobacco: Never Used  Substance and Sexual Activity  . Alcohol use: Yes    Frequency: Never  . Drug use: Never  . Sexual activity: Not on file  Lifestyle  . Physical activity:    Days per week: Not on file    Minutes per session: Not on file  . Stress: Not on file  Relationships  . Social connections:    Talks on phone: Not on file    Gets together: Not on file    Attends religious service: Not on file    Active member of club or organization: Not on file    Attends meetings of clubs or organizations: Not on file    Relationship status: Not on file  Other Topics Concern  . Not on file  Social History Narrative  . Not on file    Family History:  The patient's family history includes Aneurysm in his maternal grandmother; Breast cancer in his maternal grandmother; Heart attack in his father; Hypertension in his father.   ROS:   Please see the history of present illness.    Review of Systems  Constitution: Negative.  HENT: Negative.   Cardiovascular: Negative.   Respiratory: Negative.   Endocrine: Negative.   Hematologic/Lymphatic: Negative.   Musculoskeletal: Negative.   Gastrointestinal: Positive for dysphagia.  Genitourinary: Negative.   Neurological: Negative.    All other systems reviewed and are negative.  PHYSICAL EXAM:   VS:  BP 124/78   Pulse 73   Ht 5\' 10"  (1.778 m)   Wt 220 lb 3.2 oz (99.9 kg)   SpO2 99%   BMI 31.60 kg/m   Physical Exam  GEN: Well nourished, well developed, in no acute distress  Neck: no JVD, carotid bruits, or masses Cardiac:RRR; crisp valvular click at the apex Respiratory:  clear to auscultation bilaterally, normal work of breathing GI: soft, nontender, nondistended, + BS Ext: Right arm without hematoma or hemorrhage at cath site, good radial brachial  pulses, lower extremities without cyanosis, clubbing, or edema, Good distal pulses bilaterally Neuro:  Alert and Oriented x 3,  Psych: euthymic mood, full affect  Wt Readings from Last 3 Encounters:  10/29/18 220 lb 3.2 oz (99.9 kg)  08/13/18 214 lb (97.1 kg)  08/07/18 213 lb 6.5 oz (96.8 kg)    Studies/Labs Reviewed:   EKG:  EKG is not ordered today.   Recent Labs: 08/07/2018: BUN 7; Creatinine, Ser 1.09; Hemoglobin 14.0; Platelets 265; Potassium 3.5; Sodium 138 10/13/2018: ALT 35   Lipid Panel    Component Value Date/Time   CHOL  225 (H) 10/13/2018 0827   TRIG 137 10/13/2018 0827   HDL 37 (L) 10/13/2018 0827   CHOLHDL 6.1 (H) 10/13/2018 0827   CHOLHDL 5.7 08/07/2018 0055   VLDL 34 08/07/2018 0055   LDLCALC 161 (H) 10/13/2018 0827    Additional studies/ records that were reviewed today include:  Echocardiogram 08/06/2018 Study Conclusions - Left ventricle: The cavity size was normal. Wall thickness was   increased in a pattern of mild LVH. Systolic function was normal.   The estimated ejection fraction was in the range of 50% to 55%.   The study is not technically sufficient to allow evaluation of LV   diastolic function. - Mitral valve: Mechanical MVR - normal leaflet motion. No   obstruction. Mean gradient (D): 4 mm Hg. Valve area by pressure   half-time: 2.29 cm^2. Valve area by continuity equation (using   LVOT flow): 1.12 cm^2. - Left atrium: The atrium was normal in size. - Tricuspid valve: There was trivial regurgitation. - Pulmonary arteries: PA peak pressure: 18 mm Hg (S). - Inferior vena cava: The vessel was normal in size. The   respirophasic diameter changes were in the normal range (= 50%),   consistent with normal central venous pressure.   Impressions: - LVEF 50-55%, mild LVH, normal wall motion, mechanical MVR -   normal leaflet motion, no obstruction, normal LA size, trivial   TR, RVSP 18 mmHg, normal IVC.  _____________     ASSESSMENT:    1.  Non-ST elevation (NSTEMI) myocardial infarction (HCC)   2. H/O mitral valve replacement with mechanical valve   3. Hyperlipidemia, unspecified hyperlipidemia type    PLAN:  In order of problems listed above:  NSTEMI suspect thromboembolic with subtotal occlusion of the distal to mid LAD with successful balloon angioplasty but resulting in occlusion of a 1 mm distal diagonal branch.  Apparent mild single leaflet dysfunction of tilting-disc mechanical mitral valve which is likely source of thromboembolism.  He was treated with Aggrastat for about 4 hours followed by IV heparin and Coumadin.  Recommend aspirin 81 mg, Plavix 75 mg for a minimum of 1 month then 2 more months of Plavix along with warfarin but possibly up to a year.  Since he is asymptomatic and has no bleeding I will continue triple therapy for total of months unless he develops any complications.[place] Jude MVR at [facility] 2016  Hyperlipidemia on Lipitor.  His lipids significantly elevated especially LDL 160.  He is going to restart his exercise, he is advised that he can perform cardio but should limit weight lifting to 50 pounds.  We will recheck lipids and LFTs in 4 weeks, and refer to our lipid clinic for PCSK9 inhibitors if LDL still above 130.  Weight loss -he is advised that there is currently no recommended medication for weight loss, if he decides to start any supplements he should notify us, as it can interact with his Coumadin levels.  He is advised to exercise and start low-carb diet, this is slightly complicated as he is on Coumadin and limited to amount of greens he can eat.  Follow-up in 6 months.  Medication Adjustments/Labs and Tests Ordered: Current medicines are reviewed at length with the patient today.  Concerns regarding medicines are outlined above.  Medication changes, Labs and Tests ordered today are listed in the Patient Instructions below. Patient Instructions  Medication Instructions:   Your physician  recommends that you continue on your current medications as directed. Please refer to the  Current Medication list given to you today.  If you need a refill on your cardiac medications before your next appointment, please call your pharmacy.     Lab work:  IN ONE MONTH TO CHECK--CMET AND LIPIDS--PLEASE COME FASTING TO THIS LAB APPOINTMENT  If you have labs (blood work) drawn today and your tests are completely normal, you will receive your results only by: Marland Kitchen MyChart Message (if you have MyChart) OR . A paper copy in the mail If you have any lab test that is abnormal or we need to change your treatment, we will call you to review the results.    Follow-Up: At [facility], you and your health needs are our priority.  As part of our continuing mission to provide you with exceptional heart care, we have created designated Provider Care Teams.  These Care Teams include your primary Cardiologist (physician) and Advanced Practice Providers (APPs -  Physician Assistants and Nurse Practitioners) who all work together to provide you with the care you need, when you need it. You will need a follow up appointment in 6 months.  Please call our office 2 months in advance to schedule this appointment.  You may see Tobias Alexander, MD or one of the following Advanced Practice Providers on your designated Care Team:   [place], PA-C Ronie Spies, PA-C . [name], PA-C        Signed, Tobias Alexander, MD  10/29/2018 3:33 PM    [facility] [address] [place], [place], [place]  [address] Phone: [phone]; Fax: [phone]

## 2018-11-03 ENCOUNTER — Telehealth (HOSPITAL_COMMUNITY): Payer: Self-pay

## 2018-11-03 NOTE — Progress Notes (Signed)
Benjamin Rodriguez 33 y.o. male DOB 1986/07/20 MRN [id number]       Nutrition Screen Note  No diagnosis found. Past Medical History:  Diagnosis Date  . CAD (coronary artery disease)    a. 08/06/18- LHC- mechanical valve thromboembolic subtotal occlusion of distal-mid LAD with successful balloon angioplasty   . History of prosthetic mitral valve 2016  . Hypertension    Meds reviewed.     Current Outpatient Medications (Cardiovascular):  .  atorvastatin (LIPITOR) 80 MG tablet, Take 1 tablet (80 mg total) by mouth daily. Marland Kitchen  losartan (COZAAR) 50 MG tablet, Take 1 tablet (50 mg total) by mouth daily. .  metoprolol tartrate (LOPRESSOR) 25 MG tablet, Take 1 tablet (25 mg total) by mouth 2 (two) times daily. .  nitroGLYCERIN (NITROSTAT) 0.4 MG SL tablet, Place 1 tablet (0.4 mg total) under the tongue every 5 (five) minutes x 3 doses as needed for chest pain.   Current Outpatient Medications (Analgesics):  .  aspirin EC 81 MG EC tablet, Take 1 tablet (81 mg total) by mouth daily. For one month only.  Current Outpatient Medications (Hematological):  .  clopidogrel (PLAVIX) 75 MG tablet, Take 1 tablet (75 mg total) by mouth daily with breakfast. .  warfarin (COUMADIN) 6 MG tablet, Take 2 tabs daily except 1.5 tabs on Tuesday, Thursday, and Saturday or as directed by the Coumadin Clinic.    HT: Ht Readings from Last 1 Encounters:  10/29/18 5\' 10"  (1.778 m)    WT: Wt Readings from Last 5 Encounters:  10/29/18 220 lb 3.2 oz (99.9 kg)  08/13/18 214 lb (97.1 kg)  08/07/18 213 lb 6.5 oz (96.8 kg)  01/29/16 220 lb (99.8 kg)     BMI =31.6    10/29/18  Current tobacco use? No       Labs:  Lipid Panel     Component Value Date/Time   CHOL 225 (H) 10/13/2018 0827   TRIG 137 10/13/2018 0827   HDL 37 (L) 10/13/2018 0827   CHOLHDL 6.1 (H) 10/13/2018 0827   CHOLHDL 5.7 08/07/2018 0055   VLDL 34 08/07/2018 0055   LDLCALC 161 (H) 10/13/2018 0827    No results found for: HGBA1C CBG (last 3)   No results for input(s): GLUCAP in the last 72 hours.  Nutrition Diagnosis ? Food-and nutrition-related knowledge deficit related to lack of exposure to information as related to diagnosis of: ? CVD  ? Obese  I = 30-34.9 related to excessive energy intake as evidenced by a BMI =31.6    (10/29/18)  Nutrition Goal(s):  ? To be determined  Plan:  Pt to attend nutrition classes ? Nutrition I ? Nutrition II ? Portion Distortion  Will provide client-centered nutrition education as part of interdisciplinary care.   Monitor and evaluate progress toward nutrition goal with team.  Ross Marcus, MS, RD, LDN 11/03/2018 7:54 AM

## 2018-11-05 NOTE — Telephone Encounter (Signed)
Cardiac Rehab - Pharmacy Resident Documentation   Patient unable to be reached after three call attempts. Please complete allergy verification and medication review during patient's cardiac rehab appointment.    Amanda Pea, Ilda Basset D PGY1 Pharmacy Resident  Phone [phone]  11/05/2018      10:51 AM

## 2018-11-06 ENCOUNTER — Inpatient Hospital Stay (HOSPITAL_COMMUNITY)
Admission: RE | Admit: 2018-11-06 | Discharge: 2018-11-06 | Disposition: A | Discharge status: Home / Self Care | Payer: 59 | Source: Ambulatory Visit

## 2018-11-12 ENCOUNTER — Ambulatory Visit (INDEPENDENT_AMBULATORY_CARE_PROVIDER_SITE_OTHER): Payer: 59 | Admitting: *Deleted

## 2018-11-12 DIAGNOSIS — Z952 Presence of prosthetic heart valve: Secondary | ICD-10-CM

## 2018-11-12 DIAGNOSIS — Z5181 Encounter for therapeutic drug level monitoring: Secondary | ICD-10-CM

## 2018-11-12 LAB — POCT INR: INR: 1.6 — AB (ref 2.0–3.0)

## 2018-11-12 NOTE — Patient Instructions (Signed)
Description   Today take 2.5 tablets and tomorrow take 2 tablets then continue taking 2 tablets daily except 1.5 tablets on Tuesday, Thursday, and Saturday. Recheck INR in 2 weeks.  Coumadin Clinic [phone] Main [phone]

## 2018-11-14 ENCOUNTER — Ambulatory Visit (HOSPITAL_COMMUNITY): Payer: 59

## 2018-11-17 ENCOUNTER — Ambulatory Visit (HOSPITAL_COMMUNITY): Payer: 59

## 2018-11-19 ENCOUNTER — Ambulatory Visit (HOSPITAL_COMMUNITY): Payer: 59

## 2018-11-21 ENCOUNTER — Ambulatory Visit (HOSPITAL_COMMUNITY): Payer: 59

## 2018-11-24 ENCOUNTER — Ambulatory Visit (HOSPITAL_COMMUNITY): Payer: 59

## 2018-11-26 ENCOUNTER — Ambulatory Visit (HOSPITAL_COMMUNITY): Payer: 59

## 2018-11-27 ENCOUNTER — Other Ambulatory Visit: Payer: 59

## 2018-11-28 ENCOUNTER — Ambulatory Visit (HOSPITAL_COMMUNITY): Payer: 59

## 2018-12-01 ENCOUNTER — Ambulatory Visit (HOSPITAL_COMMUNITY): Payer: 59

## 2018-12-02 ENCOUNTER — Other Ambulatory Visit: Payer: 59

## 2018-12-02 ENCOUNTER — Ambulatory Visit (INDEPENDENT_AMBULATORY_CARE_PROVIDER_SITE_OTHER): Payer: 59 | Admitting: Pharmacist

## 2018-12-02 DIAGNOSIS — Z952 Presence of prosthetic heart valve: Secondary | ICD-10-CM | POA: Diagnosis not present

## 2018-12-02 DIAGNOSIS — I214 Non-ST elevation (NSTEMI) myocardial infarction: Secondary | ICD-10-CM | POA: Diagnosis not present

## 2018-12-02 DIAGNOSIS — Z5181 Encounter for therapeutic drug level monitoring: Secondary | ICD-10-CM

## 2018-12-02 DIAGNOSIS — E785 Hyperlipidemia, unspecified: Secondary | ICD-10-CM | POA: Diagnosis not present

## 2018-12-02 LAB — POCT INR: INR: 3.4 — AB (ref 2.0–3.0)

## 2018-12-02 NOTE — Patient Instructions (Signed)
Description   Continue taking 2 tablets daily except 1.5 tablets on Tuesday, Thursday, and Saturday. Recheck INR in 3 weeks.  Coumadin Clinic [phone] Main [phone]

## 2018-12-03 ENCOUNTER — Ambulatory Visit (HOSPITAL_COMMUNITY): Payer: 59

## 2018-12-03 LAB — COMPREHENSIVE METABOLIC PANEL
ALT: 35 IU/L (ref 0–44)
AST: 24 IU/L (ref 0–40)
Albumin/Globulin Ratio: 1.6 (ref 1.2–2.2)
Albumin: 4.5 g/dL (ref 4.0–5.0)
Alkaline Phosphatase: 81 IU/L (ref 39–117)
BUN/Creatinine Ratio: 13 (ref 9–20)
BUN: 14 mg/dL (ref 6–20)
Bilirubin Total: 0.7 mg/dL (ref 0.0–1.2)
CO2: 24 mmol/L (ref 20–29)
Calcium: 9.6 mg/dL (ref 8.7–10.2)
Chloride: 101 mmol/L (ref 96–106)
Creatinine, Ser: 1.09 mg/dL (ref 0.76–1.27)
GFR calc Af Amer: 103 mL/min/{1.73_m2} (ref >59)
GFR calc non Af Amer: 89 mL/min/{1.73_m2} (ref >59)
Globulin, Total: 2.9 g/dL (ref 1.5–4.5)
Glucose: 82 mg/dL (ref 65–99)
Potassium: 4.4 mmol/L (ref 3.5–5.2)
Sodium: 140 mmol/L (ref 134–144)
Total Protein: 7.4 g/dL (ref 6.0–8.5)

## 2018-12-03 LAB — LIPID PANEL
Chol/HDL Ratio: 4.2 ratio (ref 0.0–5.0)
Cholesterol, Total: 183 mg/dL (ref 100–199)
HDL: 44 mg/dL (ref >39)
LDL Calculated: 99 mg/dL (ref 0–99)
Triglycerides: 201 mg/dL — ABNORMAL HIGH (ref 0–149)
VLDL Cholesterol Cal: 40 mg/dL (ref 5–40)

## 2018-12-04 ENCOUNTER — Telehealth: Payer: Self-pay | Admitting: Cardiology

## 2018-12-04 MED ORDER — OMEGA-3-ACID ETHYL ESTERS 1 G PO CAPS: 2.0000 g | ORAL_CAPSULE | Freq: Every day | ORAL | 3 refills | Status: DC

## 2018-12-04 MED FILL — WARFARIN SODIUM 6 MG TABLET: 6 | 30 days supply | Qty: 60 | Fill #1 | Status: TO

## 2018-12-04 NOTE — Telephone Encounter (Signed)
Patient notified of result.  Please refer to phone note from today for complete details.   Danielle Rankin, CMA 12/04/2018 1:40 PM   Pt states he is taking his Atorvastatin, though admits he has missed several doses recently. I did advised the pt per Dr. Delton See to start Lovaza to help bring down his Trigs which are now 201. Pt is agreeable to plan of care and said to please send in Rx for Lovaza 2 gm to [facility].

## 2018-12-04 NOTE — Telephone Encounter (Signed)
Patient returning call for lab results. 

## 2018-12-05 ENCOUNTER — Ambulatory Visit (HOSPITAL_COMMUNITY): Payer: 59

## 2018-12-08 ENCOUNTER — Ambulatory Visit (HOSPITAL_COMMUNITY): Payer: 59

## 2018-12-10 ENCOUNTER — Ambulatory Visit (HOSPITAL_COMMUNITY): Payer: 59

## 2018-12-12 ENCOUNTER — Ambulatory Visit (HOSPITAL_COMMUNITY): Payer: 59

## 2018-12-15 ENCOUNTER — Ambulatory Visit (HOSPITAL_COMMUNITY): Payer: 59

## 2018-12-17 ENCOUNTER — Ambulatory Visit (HOSPITAL_COMMUNITY): Payer: 59

## 2018-12-19 ENCOUNTER — Ambulatory Visit (HOSPITAL_COMMUNITY): Payer: 59

## 2018-12-22 ENCOUNTER — Ambulatory Visit (HOSPITAL_COMMUNITY): Payer: 59

## 2018-12-22 ENCOUNTER — Telehealth: Payer: Self-pay | Admitting: Pharmacist

## 2018-12-22 NOTE — Telephone Encounter (Signed)
LMOM to prescreen pt for INR check tomorrow. 

## 2018-12-24 ENCOUNTER — Ambulatory Visit (HOSPITAL_COMMUNITY): Payer: 59

## 2018-12-26 ENCOUNTER — Ambulatory Visit (HOSPITAL_COMMUNITY): Payer: 59

## 2018-12-29 ENCOUNTER — Ambulatory Visit (HOSPITAL_COMMUNITY): Payer: 59

## 2018-12-31 ENCOUNTER — Ambulatory Visit (HOSPITAL_COMMUNITY): Payer: 59

## 2019-01-02 ENCOUNTER — Ambulatory Visit (HOSPITAL_COMMUNITY): Payer: 59

## 2019-01-05 ENCOUNTER — Ambulatory Visit (HOSPITAL_COMMUNITY): Payer: 59

## 2019-01-07 ENCOUNTER — Ambulatory Visit (HOSPITAL_COMMUNITY): Payer: 59

## 2019-01-09 ENCOUNTER — Ambulatory Visit (HOSPITAL_COMMUNITY): Payer: 59

## 2019-01-12 ENCOUNTER — Ambulatory Visit (HOSPITAL_COMMUNITY): Payer: 59

## 2019-01-14 ENCOUNTER — Other Ambulatory Visit: Payer: 59

## 2019-01-14 ENCOUNTER — Ambulatory Visit (HOSPITAL_COMMUNITY): Payer: 59

## 2019-01-16 ENCOUNTER — Ambulatory Visit (HOSPITAL_COMMUNITY): Payer: 59

## 2019-01-19 ENCOUNTER — Ambulatory Visit (HOSPITAL_COMMUNITY): Payer: 59

## 2019-01-21 ENCOUNTER — Ambulatory Visit (HOSPITAL_COMMUNITY): Payer: 59

## 2019-01-23 ENCOUNTER — Ambulatory Visit (HOSPITAL_COMMUNITY): Payer: 59

## 2019-01-26 ENCOUNTER — Ambulatory Visit (HOSPITAL_COMMUNITY): Payer: 59

## 2019-01-27 MED FILL — WARFARIN SODIUM 6 MG TABLET: 6 | 30 days supply | Qty: 60 | Fill #0

## 2019-01-28 ENCOUNTER — Ambulatory Visit (HOSPITAL_COMMUNITY): Payer: 59

## 2019-01-30 ENCOUNTER — Ambulatory Visit (HOSPITAL_COMMUNITY): Payer: 59

## 2019-02-02 ENCOUNTER — Ambulatory Visit (HOSPITAL_COMMUNITY): Payer: 59

## 2019-02-04 ENCOUNTER — Ambulatory Visit (HOSPITAL_COMMUNITY): Payer: 59

## 2019-02-06 ENCOUNTER — Ambulatory Visit (HOSPITAL_COMMUNITY): Payer: 59

## 2019-02-09 ENCOUNTER — Ambulatory Visit (HOSPITAL_COMMUNITY): Payer: 59

## 2019-02-11 ENCOUNTER — Ambulatory Visit (HOSPITAL_COMMUNITY): Payer: 59

## 2019-02-13 ENCOUNTER — Ambulatory Visit (HOSPITAL_COMMUNITY): Payer: 59

## 2019-03-23 ENCOUNTER — Other Ambulatory Visit: Payer: 59

## 2019-03-23 ENCOUNTER — Other Ambulatory Visit: Payer: Self-pay | Admitting: Cardiology

## 2019-03-23 NOTE — Telephone Encounter (Signed)
Pt overdue for INR check, last seen on 12/02/2018; left a msg for the pt to call back as we cannot fill the medication as he needs an appt.

## 2019-04-02 ENCOUNTER — Telehealth: Payer: Self-pay | Admitting: Pharmacist

## 2019-04-02 MED ORDER — WARFARIN SODIUM 6 MG PO TABS: ORAL_TABLET | ORAL | 0 refills | Status: DC

## 2019-04-02 MED FILL — WARFARIN SODIUM 6 MG TABLET: 6 | 30 days supply | Qty: 60 | Fill #0

## 2019-04-02 NOTE — Telephone Encounter (Signed)
Patient called to make appointment with coumadin clinic. Has not been seen since march. Patient reports hes been out of coumadin for a week. Next available appointment is 7/23. Will send one month supply to pharmacy and I have scheduled him for 7/23 at 4:15

## 2019-04-02 NOTE — Telephone Encounter (Signed)
Called pt because he is overdue for INR check and has requested a refill. Left pt a message to call back to schedule an appt as he is overdue and INR should be monitired, and that the refill will be denied at this time as we have been trying to reach him since 03/23/2019 to schedule and appointment and to call [phone] or [phone] when he gets this message.

## 2019-04-14 ENCOUNTER — Telehealth: Payer: Self-pay

## 2019-04-14 NOTE — Telephone Encounter (Signed)
lmom for prescreen  

## 2019-04-16 ENCOUNTER — Ambulatory Visit (INDEPENDENT_AMBULATORY_CARE_PROVIDER_SITE_OTHER): Payer: 59 | Admitting: *Deleted

## 2019-04-16 ENCOUNTER — Other Ambulatory Visit: Payer: Self-pay

## 2019-04-16 DIAGNOSIS — Z5181 Encounter for therapeutic drug level monitoring: Secondary | ICD-10-CM

## 2019-04-16 DIAGNOSIS — Z952 Presence of prosthetic heart valve: Secondary | ICD-10-CM | POA: Diagnosis not present

## 2019-04-16 LAB — POCT INR: INR: 2.7 (ref 2.0–3.0)

## 2019-04-16 NOTE — Patient Instructions (Signed)
Description   Continue taking 2 tablets daily except 3 tablets on Tuesdays. Recheck INR in 2 weeks.  Coumadin Clinic [phone] Main [phone]

## 2019-04-30 ENCOUNTER — Other Ambulatory Visit: Payer: Self-pay

## 2019-04-30 ENCOUNTER — Ambulatory Visit (INDEPENDENT_AMBULATORY_CARE_PROVIDER_SITE_OTHER): Payer: 59 | Admitting: *Deleted

## 2019-04-30 DIAGNOSIS — Z952 Presence of prosthetic heart valve: Secondary | ICD-10-CM | POA: Diagnosis not present

## 2019-04-30 DIAGNOSIS — Z5181 Encounter for therapeutic drug level monitoring: Secondary | ICD-10-CM | POA: Diagnosis not present

## 2019-04-30 LAB — POCT INR: INR: 1.1 — AB (ref 2.0–3.0)

## 2019-04-30 MED ORDER — WARFARIN SODIUM 6 MG PO TABS: ORAL_TABLET | ORAL | 0 refills | Status: DC

## 2019-04-30 MED FILL — WARFARIN SODIUM 6 MG TABLET: 6 | 30 days supply | Qty: 70 | Fill #0

## 2019-04-30 NOTE — Patient Instructions (Addendum)
Description   Today and tomorrow take 3 tablets then take 2 tablets daily except 3 tablets on Tuesdays. Recheck INR in 1 week.  Coumadin Clinic [phone] Main [phone]

## 2019-04-30 NOTE — Addendum Note (Signed)
Addended by: Derrel Nip B on: 04/30/2019 04:51 PM   Modules accepted: Orders

## 2019-05-07 ENCOUNTER — Ambulatory Visit (INDEPENDENT_AMBULATORY_CARE_PROVIDER_SITE_OTHER): Payer: 59 | Admitting: *Deleted

## 2019-05-07 ENCOUNTER — Other Ambulatory Visit: Payer: Self-pay

## 2019-05-07 DIAGNOSIS — Z5181 Encounter for therapeutic drug level monitoring: Secondary | ICD-10-CM | POA: Diagnosis not present

## 2019-05-07 DIAGNOSIS — Z952 Presence of prosthetic heart valve: Secondary | ICD-10-CM | POA: Diagnosis not present

## 2019-05-07 LAB — POCT INR: INR: 2.7 (ref 2.0–3.0)

## 2019-05-07 NOTE — Patient Instructions (Signed)
Description   Continue to take 2 tablets daily except 3 tablets on Tuesdays. Recheck INR in 2 weeks.  Coumadin Clinic [phone] Main [phone]

## 2019-05-21 ENCOUNTER — Ambulatory Visit (INDEPENDENT_AMBULATORY_CARE_PROVIDER_SITE_OTHER): Payer: 59 | Admitting: Pharmacist

## 2019-05-21 ENCOUNTER — Other Ambulatory Visit: Payer: Self-pay

## 2019-05-21 DIAGNOSIS — Z952 Presence of prosthetic heart valve: Secondary | ICD-10-CM | POA: Diagnosis not present

## 2019-05-21 DIAGNOSIS — Z5181 Encounter for therapeutic drug level monitoring: Secondary | ICD-10-CM | POA: Diagnosis not present

## 2019-05-21 LAB — POCT INR: INR: 3.1 — AB (ref 2.0–3.0)

## 2019-05-21 NOTE — Patient Instructions (Signed)
Description   Continue to take 2 tablets daily except 3 tablets on Tuesdays. Recheck INR in 6 weeks.  Coumadin Clinic [phone] Main [phone]

## 2019-06-08 NOTE — Progress Notes (Signed)
Cardiology Office Note    Date:  06/09/2019   ID:  Benjamin Rodriguez, DOB 01-Oct-1985, MRN [id number]  PCP:  Patient, No Pcp Per  Cardiologist: Tobias AlexanderKatarina Parish Dubose, MD EPS: None  Reason for visit: 6 months follow-up  History of Present Illness:  Benjamin FieldShedrick Rodriguez is a 33 y.o. male with history of severe MR S/P [facility]VR [facility].Stopped his Coumadin 2 months ago when he moved to [place].  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  Suspect thromboembolic sub-total occlusion of the distal mid LAD with successful balloon angioplasty restoring TIMI-3 flow.  This resulted in occlusion of a 1 mm distal diagonal branch.  LVEF with apical anterior hypokinesis.  He was restarted on Coumadin.  2D echo showed akinesis of the apical walls overall LVEF 40 to 55% mean gradient across the mitral valve 4 mmHg.  Plan is for Plavix, aspirin and Coumadin for a minimum of 1 month and then another year of Plavix and Coumadin and after that Coumadin alone.  08/13/18 - the patient here for post hospital f/u. A little short of breath with going up stairs or walking long distance. Also sharp pain and food and pills getting stuck when he swallows but that's getting better. No chest pain.  Complains of excessive sweating at night.  Going to do cardiac rehab in January.  Works for [employer].  10/29/2018 -the patient is coming after 3 months, he has been feeling great, he denies any chest pain shortness of breath dizziness or syncope.  He has been compliant with his medications and has no side effects such as bleeding or muscle pain.  He used to go to gym every morning and currently is unsure how much he can exercise and as a result has gained 30 pounds.  He would like to know what exercise regimen he can do and if there are any supplements he can use for weight loss.  06/09/2019 - the patient is coming after 6 months, he is now compliant with warfarin, last INR  3.1 on 05/21/2019, he ran out of the rest of is medications.  He is working from home, feels well, goes for daily walks and denies any chest pain or shortness of breath, no orthopnea proximal nocturnal dyspnea palpitations.  He complains of weight gain ever since he quit going to the gym.  Past Medical History:  Diagnosis Date  . CAD (coronary artery disease)    a. 08/06/18- LHC- mechanical valve thromboembolic subtotal occlusion of distal-mid LAD with successful balloon angioplasty   . History of prosthetic mitral valve 2016  . Hypertension     Past Surgical History:  Procedure Laterality Date  . CARDIAC SURGERY    . CORONARY BALLOON ANGIOPLASTY N/A 08/06/2018   Procedure: CORONARY BALLOON ANGIOPLASTY;  Surgeon: Marykay LexHarding, David W, MD;  [facility];  Service: Cardiovascular;  Laterality: N/A;  . LEFT HEART CATH AND CORONARY ANGIOGRAPHY N/A 08/06/2018   Procedure: LEFT HEART CATH AND CORONARY ANGIOGRAPHY;  Surgeon: Marykay LexHarding, David W, MD;  [facility];  Service: Cardiovascular;  Laterality: N/A;  . MITRAL VALVE REPLACEMENT      Current Medications: Current Meds  Medication Sig  . nitroGLYCERIN (NITROSTAT) 0.4 MG SL tablet Place 1 tablet (0.4 mg total) under the tongue every 5 (five) minutes x 3 doses as needed for chest pain.  Marland Kitchen. warfarin (COUMADIN) 6 MG tablet Take 2 tabs daily except 3 tabs on Tuesday or  as directed by the Coumadin Clinic.     Allergies:   Patient has no known allergies.   Social History   Socioeconomic History  . Marital status: Single    Spouse name: Not on file  . Number of children: Not on file  . Years of education: Not on file  . Highest education level: Not on file  Occupational History  . Occupation: works at [employer] in the prior [place] department  Social Needs  . Financial resource strain: Not on file  . Food insecurity    Worry: Not on file    Inability: Not on file  . Transportation needs    Medical: Not on file     Non-medical: Not on file  Tobacco Use  . Smoking status: Never Smoker  . Smokeless tobacco: Never Used  Substance and Sexual Activity  . Alcohol use: Yes    Frequency: Never  . Drug use: Never  . Sexual activity: Not on file  Lifestyle  . Physical activity    Days per week: Not on file    Minutes per session: Not on file  . Stress: Not on file  Relationships  . Social Musicianconnections    Talks on phone: Not on file    Gets together: Not on file    Attends religious service: Not on file    Active member of club or organization: Not on file    Attends meetings of clubs or organizations: Not on file    Relationship status: Not on file  Other Topics Concern  . Not on file  Social History Narrative  . Not on file    Family History:  The patient's family history includes Aneurysm in his maternal grandmother; Breast cancer in his maternal grandmother; Heart attack in his father; Hypertension in his father.   ROS:   Please see the history of present illness.    Review of Systems  Constitution: Negative.  HENT: Negative.   Cardiovascular: Negative.   Respiratory: Negative.   Endocrine: Negative.   Hematologic/Lymphatic: Negative.   Musculoskeletal: Negative.   Gastrointestinal: Positive for dysphagia.  Genitourinary: Negative.   Neurological: Negative.    All other systems reviewed and are negative.  PHYSICAL EXAM:   VS:  BP 120/76   Pulse 85   Ht 5\' 9"  (1.753 m)   Wt 230 lb 12.8 oz (104.7 kg)   SpO2 96%   BMI 34.08 kg/m    Physical Exam  GEN: Well nourished, well developed, in no acute distress  Neck: no JVD, carotid bruits, or masses Cardiac:RRR; crisp valvular click at the apex Respiratory:  clear to auscultation bilaterally, normal work of breathing GI: soft, nontender, nondistended, + BS Ext: Right arm without hematoma or hemorrhage at cath site, good radial brachial pulses, lower extremities without cyanosis, clubbing, or edema, Good distal pulses bilaterally  Neuro:  Alert and Oriented x 3,  Psych: euthymic mood, full affect  Wt Readings from Last 3 Encounters:  06/09/19 230 lb 12.8 oz (104.7 kg)  10/29/18 220 lb 3.2 oz (99.9 kg)  08/13/18 214 lb (97.1 kg)    Studies/Labs Reviewed:   EKG:  EKG is ordered today.  It shows normal sinus rhythm otherwise normal EKG, negative T waves in the inferolateral leads have resolved.  This was personally reviewed.   Recent Labs: 08/07/2018: Hemoglobin 14.0; Platelets 265 12/02/2018: ALT 35; BUN 14; Creatinine, Ser 1.09; Potassium 4.4; Sodium 140   Lipid Panel    Component Value Date/Time   CHOL 183  12/02/2018 1459   TRIG 201 (H) 12/02/2018 1459   HDL 44 12/02/2018 1459   CHOLHDL 4.2 12/02/2018 1459   CHOLHDL 5.7 08/07/2018 0055   VLDL 34 08/07/2018 0055   LDLCALC 99 12/02/2018 1459    Additional studies/ records that were reviewed today include:  Echocardiogram 08/06/2018 Study Conclusions - Left ventricle: The cavity size was normal. Wall thickness was   increased in a pattern of mild LVH. Systolic function was normal.   The estimated ejection fraction was in the range of 50% to 55%.   The study is not technically sufficient to allow evaluation of LV   diastolic function. - Mitral valve: Mechanical MVR - normal leaflet motion. No   obstruction. Mean gradient (D): 4 mm Hg. Valve area by pressure   half-time: 2.29 cm^2. Valve area by continuity equation (using   LVOT flow): 1.12 cm^2. - Left atrium: The atrium was normal in size. - Tricuspid valve: There was trivial regurgitation. - Pulmonary arteries: PA peak pressure: 18 mm Hg (S). - Inferior vena cava: The vessel was normal in size. The   respirophasic diameter changes were in the normal range (= 50%),   consistent with normal central venous pressure.   Impressions: - LVEF 50-55%, mild LVH, normal wall motion, mechanical MVR -   normal leaflet motion, no obstruction, normal LA size, trivial   TR, RVSP 18 mmHg, normal IVC.   _____________     ASSESSMENT:    1. Encounter for therapeutic drug monitoring   2. H/O mitral valve replacement with mechanical valve   3. Non-ST elevation (NSTEMI) myocardial infarction (HCC)   4. Hyperlipidemia, unspecified hyperlipidemia type   5. Dysphagia, unspecified type    PLAN:  In order of problems listed above:  NSTEMI suspect thromboembolic with subtotal occlusion of the distal to mid LAD with successful balloon angioplasty but resulting in occlusion of a 1 mm distal diagonal branch.  Apparent mild single leaflet dysfunction of tilting-disc mechanical mitral valve which is likely source of thromboembolism.  He was treated with Aggrastat for about 4 hours followed by IV heparin and Coumadin.  Recommend aspirin 81 mg, Plavix 75 mg for a minimum of 1 month then 2 more months of Plavix along with warfarin but possibly up to a year. He has been off Plavix and aspirin for several months but was compliant with warfarin, will obtain new echocardiogram to reevaluate he is valve and gradients as well as LVEF.  I will restart Plavix now.  Saint Jude MVR at [facility] 2016 -as above  Hyperlipidemia on Lipitor and Lovaza, he ran out several months ago, we will restart atorvastatin and check lipids prior to the next visit in 6 months  Weight loss -he is advised to perform cardio exercises for 1 hour daily.  Follow-up in 6 months.  Obtain echo now.  Labs prior to the visit in 6 months.  I am going to restart atorvastatin, Lovaza, metoprolol and Plavix.  I will discontinue aspirin, if he has persistent LV dysfunction I would restart losartan as well but hold for now.  Medication Adjustments/Labs and Tests Ordered: Current medicines are reviewed at length with the patient today.  Concerns regarding medicines are outlined above.  Medication changes, Labs and Tests ordered today are listed in the Patient Instructions below. Patient Instructions  Medication Instructions:  .Your physician has  recommended you make the following change in your medication: Stop Losartan and aspirin   If you need a refill on your cardiac medications before your  next appointment, please call your pharmacy.   Lab work: IN 6 MONTHS; CMP, CBC, LIPIDS, TSH... FASTING... PRIOR TO YOUR 6 MONTH FOLLOW UP If you have labs (blood work) drawn today and your tests are completely normal, you will receive your results only by: Marland Kitchen MyChart Message (if you have MyChart) OR . A paper copy in the mail If you have any lab test that is abnormal or we need to change your treatment, we will call you to review the results.  Testing/Procedures: Your physician has requested that you have an echocardiogram. Echocardiography is a painless test that uses sound waves to create images of your heart. It provides your doctor with information about the size and shape of your heart and how well your heart's chambers and valves are working. This procedure takes approximately one hour. There are no restrictions for this procedure.      Follow-Up: At [facility], you and your health needs are our priority.  As part of our continuing mission to provide you with exceptional heart care, we have created designated Provider Care Teams.  These Care Teams include your primary Cardiologist (physician) and Advanced Practice Providers (APPs -  Physician Assistants and Nurse Practitioners) who all work together to provide you with the care you need, when you need it. You will need a follow up appointment in 6 weeks.  Please call our office 2 months in advance to schedule this appointment.  You may see Ena Dawley, MD or one of the following Advanced Practice Providers on your designated Care Team:   [place], PA-C Melina Copa, PA-C . Ermalinda Barrios, PA-C  Any Other Special Instructions Will Be Listed Below (If Applicable).       Signed, Ena Dawley, MD  06/09/2019 1:49 PM    [facility] [place], [place], [place]  [address] Phone: [phone]; Fax: [phone]

## 2019-06-09 ENCOUNTER — Other Ambulatory Visit: Payer: Self-pay

## 2019-06-09 ENCOUNTER — Encounter: Payer: Self-pay | Admitting: Cardiology

## 2019-06-09 ENCOUNTER — Ambulatory Visit (INDEPENDENT_AMBULATORY_CARE_PROVIDER_SITE_OTHER): Payer: 59 | Admitting: Cardiology

## 2019-06-09 ENCOUNTER — Telehealth: Payer: Self-pay

## 2019-06-09 VITALS — BP 120/76 | HR 85 | Ht 69.0 in | Wt 230.8 lb

## 2019-06-09 DIAGNOSIS — Z5181 Encounter for therapeutic drug level monitoring: Secondary | ICD-10-CM | POA: Diagnosis not present

## 2019-06-09 DIAGNOSIS — R1310 Dysphagia, unspecified: Secondary | ICD-10-CM | POA: Diagnosis not present

## 2019-06-09 DIAGNOSIS — E785 Hyperlipidemia, unspecified: Secondary | ICD-10-CM | POA: Diagnosis not present

## 2019-06-09 DIAGNOSIS — I214 Non-ST elevation (NSTEMI) myocardial infarction: Secondary | ICD-10-CM

## 2019-06-09 DIAGNOSIS — Z952 Presence of prosthetic heart valve: Secondary | ICD-10-CM | POA: Diagnosis not present

## 2019-06-09 MED ORDER — OMEGA-3-ACID ETHYL ESTERS 1 G PO CAPS: 1.0000 g | ORAL_CAPSULE | Freq: Two times a day (BID) | ORAL | 3 refills | Status: DC

## 2019-06-09 MED ORDER — NITROGLYCERIN 0.4 MG SL SUBL: 0.4000 mg | SUBLINGUAL_TABLET | SUBLINGUAL | 12 refills | Status: DC | PRN

## 2019-06-09 MED ORDER — METOPROLOL TARTRATE 25 MG PO TABS: 25.0000 mg | ORAL_TABLET | Freq: Two times a day (BID) | ORAL | 3 refills | Status: DC

## 2019-06-09 MED ORDER — ATORVASTATIN CALCIUM 80 MG PO TABS: 80.0000 mg | ORAL_TABLET | Freq: Every day | ORAL | 3 refills | Status: DC

## 2019-06-09 MED ORDER — CLOPIDOGREL BISULFATE 75 MG PO TABS: 75.0000 mg | ORAL_TABLET | Freq: Every day | ORAL | 3 refills | Status: DC

## 2019-06-09 MED FILL — OMEGA-3 ETHYL ESTER 1 GM CA: 1 | 90 days supply | Qty: 180 | Fill #0

## 2019-06-09 MED FILL — METOPROLOL TARTRATE 25 MG T: 25 | 90 days supply | Qty: 180 | Fill #0

## 2019-06-09 MED FILL — ATORVASTATIN 80 MG TABLET: 80 | 90 days supply | Qty: 90 | Fill #0

## 2019-06-09 MED FILL — CLOPIDOGREL 75 MG TABLET: 75 | 90 days supply | Qty: 90 | Fill #0

## 2019-06-09 MED FILL — NITROGLYCERIN 0.4 MG TAB SL: 0.4 | 15 days supply | Qty: 25 | Fill #0

## 2019-06-09 NOTE — Telephone Encounter (Signed)
Pt called and updated AVS and med list mailed to the pt. From 06/09/19 OV with Dr. Meda Coffee.

## 2019-06-09 NOTE — Patient Instructions (Addendum)
Medication Instructions:  .Your physician has recommended you make the following change in your medication: Stop Losartan and aspirin... restart Plavix, metoprolol, atorvastatin, lovaza...sent to [place]. Continue Warfarin and Nitroglycerin.    If you need a refill on your cardiac medications before your next appointment, please call your pharmacy.   Lab work: IN 6 MONTHS; CMP, CBC, LIPIDS, TSH... FASTING... PRIOR TO YOUR 6 MONTH FOLLOW UP If you have labs (blood work) drawn today and your tests are completely normal, you will receive your results only by: Marland Kitchen MyChart Message (if you have MyChart) OR . A paper copy in the mail If you have any lab test that is abnormal or we need to change your treatment, we will call you to review the results.  Testing/Procedures: Your physician has requested that you have an echocardiogram. Echocardiography is a painless test that uses sound waves to create images of your heart. It provides your doctor with information about the size and shape of your heart and how well your heart's chambers and valves are working. This procedure takes approximately one hour. There are no restrictions for this procedure.      Follow-Up: At [facility], you and your health needs are our priority.  As part of our continuing mission to provide you with exceptional heart care, we have created designated Provider Care Teams.  These Care Teams include your primary Cardiologist (physician) and Advanced Practice Providers (APPs -  Physician Assistants and Nurse Practitioners) who all work together to provide you with the care you need, when you need it. You will need a follow up appointment in 6 months.  Please call our office 2 months in advance to schedule this appointment.  You may see Ena Dawley, MD or one of the following Advanced Practice Providers on your designated Care Team:   [place], PA-C Melina Copa, PA-C . Ermalinda Barrios, PA-C  Any Other  Special Instructions Will Be Listed Below (If Applicable).

## 2019-06-17 ENCOUNTER — Other Ambulatory Visit: Payer: Self-pay

## 2019-06-17 ENCOUNTER — Ambulatory Visit (HOSPITAL_COMMUNITY): Payer: 59 | Attending: Cardiology

## 2019-06-17 DIAGNOSIS — Z5181 Encounter for therapeutic drug level monitoring: Secondary | ICD-10-CM | POA: Insufficient documentation

## 2019-06-17 DIAGNOSIS — I214 Non-ST elevation (NSTEMI) myocardial infarction: Secondary | ICD-10-CM | POA: Insufficient documentation

## 2019-06-17 DIAGNOSIS — R1310 Dysphagia, unspecified: Secondary | ICD-10-CM | POA: Insufficient documentation

## 2019-06-17 DIAGNOSIS — Z952 Presence of prosthetic heart valve: Secondary | ICD-10-CM | POA: Diagnosis not present

## 2019-06-17 DIAGNOSIS — E785 Hyperlipidemia, unspecified: Secondary | ICD-10-CM | POA: Insufficient documentation

## 2019-06-22 ENCOUNTER — Telehealth: Payer: Self-pay

## 2019-06-22 NOTE — Telephone Encounter (Signed)
Notes recorded by Frederik Schmidt, RN on 06/22/2019 at 3:06 PM EDT  The patient has been notified of the Echo result and verbalized understanding. All questions (if any) were answered.  Frederik Schmidt, RN 06/22/2019 3:06 PM

## 2019-06-22 NOTE — Telephone Encounter (Signed)
-----   Message from Sueanne Margarita, MD sent at 06/22/2019  2:31 PM EDT ----- Echo showed low normal LVF with hypokinesis of the septum, mildly enlarged LA, stable MVR and trivial MR, mildly leaky TC

## 2019-06-26 ENCOUNTER — Other Ambulatory Visit: Payer: Self-pay | Admitting: Cardiology

## 2019-06-26 MED FILL — WARFARIN SODIUM 6 MG TABLET: 6 | 65 days supply | Qty: 65 | Fill #0

## 2019-07-02 ENCOUNTER — Ambulatory Visit (INDEPENDENT_AMBULATORY_CARE_PROVIDER_SITE_OTHER): Payer: 59 | Admitting: *Deleted

## 2019-07-02 ENCOUNTER — Other Ambulatory Visit: Payer: Self-pay

## 2019-07-02 DIAGNOSIS — Z952 Presence of prosthetic heart valve: Secondary | ICD-10-CM | POA: Diagnosis not present

## 2019-07-02 DIAGNOSIS — Z5181 Encounter for therapeutic drug level monitoring: Secondary | ICD-10-CM

## 2019-07-02 LAB — POCT INR: INR: 3.1 — AB (ref 2.0–3.0)

## 2019-07-02 NOTE — Patient Instructions (Signed)
Description   Continue to take 2 tablets daily except 3 tablets on Tuesdays. Recheck INR in 6 weeks.  Coumadin Clinic [phone] Main [phone]

## 2019-08-05 ENCOUNTER — Other Ambulatory Visit: Payer: Self-pay | Admitting: Cardiology

## 2019-08-05 MED FILL — WARFARIN SODIUM 6 MG TABLET: 6 | 30 days supply | Qty: 65 | Fill #0

## 2019-08-07 ENCOUNTER — Other Ambulatory Visit: Payer: Self-pay | Admitting: Cardiology

## 2019-08-10 NOTE — Telephone Encounter (Signed)
Refill was sent 08/05/2019, called pharmacy to confirm they have received the refill and was told they have and the pt has picked it up. Will not refill until pt adheres to next appt on 08/13/2019.

## 2019-08-13 ENCOUNTER — Ambulatory Visit (INDEPENDENT_AMBULATORY_CARE_PROVIDER_SITE_OTHER): Payer: 59 | Admitting: *Deleted

## 2019-08-13 ENCOUNTER — Other Ambulatory Visit: Payer: Self-pay

## 2019-08-13 DIAGNOSIS — Z5181 Encounter for therapeutic drug level monitoring: Secondary | ICD-10-CM | POA: Diagnosis not present

## 2019-08-13 DIAGNOSIS — Z952 Presence of prosthetic heart valve: Secondary | ICD-10-CM

## 2019-08-13 LAB — POCT INR: INR: 5.1 — AB (ref 2.0–3.0)

## 2019-08-13 NOTE — Patient Instructions (Addendum)
Description   Do not take any Warfarin tomorrow and No Warfarin Saturday then continue to take 2 tablets daily except 3 tablets on Tuesdays. Recheck INR in 2 weeks (usually 6wks). Coumadin Clinic [phone] Main [phone]

## 2019-08-27 ENCOUNTER — Other Ambulatory Visit: Payer: Self-pay

## 2019-08-27 ENCOUNTER — Ambulatory Visit (INDEPENDENT_AMBULATORY_CARE_PROVIDER_SITE_OTHER): Payer: 59 | Admitting: *Deleted

## 2019-08-27 DIAGNOSIS — Z5181 Encounter for therapeutic drug level monitoring: Secondary | ICD-10-CM

## 2019-08-27 DIAGNOSIS — Z952 Presence of prosthetic heart valve: Secondary | ICD-10-CM

## 2019-08-27 LAB — POCT INR: INR: 3.3 — AB (ref 2.0–3.0)

## 2019-08-27 NOTE — Patient Instructions (Signed)
Description   Continue to take 2 tablets daily except 3 tablets on Tuesdays. Recheck INR in 3 weeks (usually 6wks). Coumadin Clinic [phone] Main [phone]

## 2019-09-16 ENCOUNTER — Other Ambulatory Visit: Payer: Self-pay | Admitting: Cardiology

## 2019-09-16 MED FILL — WARFARIN SODIUM 6 MG TABLET: 6 | 30 days supply | Qty: 65 | Fill #0

## 2019-10-22 ENCOUNTER — Ambulatory Visit: Payer: 59 | Attending: Internal Medicine

## 2019-10-22 DIAGNOSIS — Z20822 Contact with and (suspected) exposure to covid-19: Secondary | ICD-10-CM

## 2019-10-23 LAB — NOVEL CORONAVIRUS, NAA: SARS-CoV-2, NAA: DETECTED — AB

## 2019-10-23 NOTE — Progress Notes (Signed)
Your test for COVID-19 was positive ("detected"), meaning that you were infected with the novel coronavirus and could give the germ to others.    Please continue isolation at home, for at least 10 days since the start of your fever/cough/breathlessness and until you have had 24 hours without fever (without taking a fever reducer) and with any cough/breathlessness improving. Use over-the-counter medications for symptoms.  If you have had no symptoms, but were exposed to someone who was positive for COVID-19, you will need to quarantine and self-isolate for 14 days from the date of exposure.    Please continue good preventive care measures, including:  frequent hand-washing, avoid touching your face, cover coughs/sneezes, stay out of crowds and keep a 6 foot distance from others.  Clean hard surfaces touched frequently with disinfectant cleaning products.   Please check in with your primary care provider about your positive test result.  Go to the nearest urgent care or ED for assessment if you have severe breathlessness or severe weakness/fatigue (ex needing new help getting out of bed or to the bathroom).  Members of your household will also need to quarantine for 14 days from the date of your positive test. You may be contacted to discuss possible treatment options, and you may also be contacted by the health department for follow up. Please call [place] at [phone] if you have any questions or concerns.     

## 2019-10-28 ENCOUNTER — Ambulatory Visit: Payer: 59 | Attending: Internal Medicine

## 2019-10-28 DIAGNOSIS — Z20822 Contact with and (suspected) exposure to covid-19: Secondary | ICD-10-CM | POA: Diagnosis not present

## 2019-10-29 LAB — NOVEL CORONAVIRUS, NAA: SARS-CoV-2, NAA: NOT DETECTED

## 2019-11-11 ENCOUNTER — Other Ambulatory Visit: Payer: Self-pay | Admitting: Cardiology

## 2019-11-11 MED FILL — WARFARIN SODIUM 6 MG TABLET: 6 | 7 days supply | Qty: 15 | Fill #0

## 2019-11-11 NOTE — Telephone Encounter (Signed)
Pt is Overdue for follow-up called pt scheduled f/u appt for 11/18/19.  Pt states he ran out of Warfarin on 11/10/19.  Pt has missed 1 day per his report.  Will send in a 1 week supply to pt's pharmacy to get him to upcoming appt on 11/18/19.  Pt is aware of 1 week refill and appt date and time.

## 2019-11-25 ENCOUNTER — Other Ambulatory Visit: Payer: 59 | Admitting: *Deleted

## 2019-11-25 ENCOUNTER — Other Ambulatory Visit: Payer: Self-pay

## 2019-11-25 DIAGNOSIS — R1310 Dysphagia, unspecified: Secondary | ICD-10-CM

## 2019-11-25 DIAGNOSIS — Z952 Presence of prosthetic heart valve: Secondary | ICD-10-CM | POA: Diagnosis not present

## 2019-11-25 DIAGNOSIS — I214 Non-ST elevation (NSTEMI) myocardial infarction: Secondary | ICD-10-CM | POA: Diagnosis not present

## 2019-11-25 DIAGNOSIS — Z5181 Encounter for therapeutic drug level monitoring: Secondary | ICD-10-CM

## 2019-11-25 DIAGNOSIS — E785 Hyperlipidemia, unspecified: Secondary | ICD-10-CM | POA: Diagnosis not present

## 2019-11-25 LAB — LIPID PANEL
Chol/HDL Ratio: 5.4 ratio — ABNORMAL HIGH (ref 0.0–5.0)
Cholesterol, Total: 232 mg/dL — ABNORMAL HIGH (ref 100–199)
HDL: 43 mg/dL (ref >39)
LDL Chol Calc (NIH): 160 mg/dL — ABNORMAL HIGH (ref 0–99)
Triglycerides: 157 mg/dL — ABNORMAL HIGH (ref 0–149)
VLDL Cholesterol Cal: 29 mg/dL (ref 5–40)

## 2019-11-25 LAB — COMPREHENSIVE METABOLIC PANEL
ALT: 30 IU/L (ref 0–44)
AST: 29 IU/L (ref 0–40)
Albumin/Globulin Ratio: 1.9 (ref 1.2–2.2)
Albumin: 4.8 g/dL (ref 4.0–5.0)
Alkaline Phosphatase: 64 IU/L (ref 39–117)
BUN/Creatinine Ratio: 7 — ABNORMAL LOW (ref 9–20)
BUN: 7 mg/dL (ref 6–20)
Bilirubin Total: 0.7 mg/dL (ref 0.0–1.2)
CO2: 24 mmol/L (ref 20–29)
Calcium: 9 mg/dL (ref 8.7–10.2)
Chloride: 103 mmol/L (ref 96–106)
Creatinine, Ser: 0.97 mg/dL (ref 0.76–1.27)
GFR calc Af Amer: 117 mL/min/{1.73_m2} (ref >59)
GFR calc non Af Amer: 101 mL/min/{1.73_m2} (ref >59)
Globulin, Total: 2.5 g/dL (ref 1.5–4.5)
Glucose: 86 mg/dL (ref 65–99)
Potassium: 4.2 mmol/L (ref 3.5–5.2)
Sodium: 140 mmol/L (ref 134–144)
Total Protein: 7.3 g/dL (ref 6.0–8.5)

## 2019-11-25 LAB — CBC
Hematocrit: 42.8 % (ref 37.5–51.0)
Hemoglobin: 14.7 g/dL (ref 13.0–17.7)
MCH: 29.8 pg (ref 26.6–33.0)
MCHC: 34.3 g/dL (ref 31.5–35.7)
MCV: 87 fL (ref 79–97)
Platelets: 250 10*3/uL (ref 150–450)
RBC: 4.94 x10E6/uL (ref 4.14–5.80)
RDW: 13.1 % (ref 11.6–15.4)
WBC: 4.7 10*3/uL (ref 3.4–10.8)

## 2019-11-25 LAB — TSH: TSH: 3.82 u[IU]/mL (ref 0.450–4.500)

## 2019-11-26 ENCOUNTER — Telehealth: Payer: Self-pay | Admitting: *Deleted

## 2019-11-26 DIAGNOSIS — E785 Hyperlipidemia, unspecified: Secondary | ICD-10-CM

## 2019-11-26 DIAGNOSIS — I214 Non-ST elevation (NSTEMI) myocardial infarction: Secondary | ICD-10-CM

## 2019-11-26 DIAGNOSIS — E782 Mixed hyperlipidemia: Secondary | ICD-10-CM

## 2019-11-26 MED ORDER — WARFARIN SODIUM 6 MG PO TABS: ORAL_TABLET | ORAL | 0 refills | Status: DC

## 2019-11-26 MED FILL — WARFARIN SODIUM 6 MG TABLET: 6 | 7 days supply | Qty: 15 | Fill #0

## 2019-11-26 NOTE — Telephone Encounter (Signed)
Spoke with the pt and informed him of lab results and recommendations per Dr. Delton See. Confirmed with the pt if he was taking his prescribed atorvastatin 80 mg po daily, and he stated "he has not taking it for awhile now." Informed the pt that per Dr. Delton See, given he has not been taking his atorvastatin as prescribed, he needs to restart this medication and come for repeat lipids in 4 weeks.  Pt states he has enough refills of atorvastatin on hand.  Scheduled the pt for repeat lipids in 4 weeks on 4/1 /21.  Pt is aware to come fasting to this lab appt. Pt verbalized understanding and agrees with this plan.

## 2019-11-26 NOTE — Telephone Encounter (Signed)
Spoke with pt and he is aware he is overdue, last seen in December 2020. Pt had an appt last week but did not show up. He states he was busy and forget about it. Educated the pt over the importance of having the INR monitored and being compliant with appts. Pt has not taken any Warfarin since last week so pt will resume his Warfarin dose tonight once picked up with his current dose of 2 tablets daily except 3 tablets on Tuesdays. Also, pt states he had COVID 19 since last visit and he did not have to be hospitalized. He currently does not have any symptoms and has been symptom free for over 3 weeks. Made pt aware that the appt that he is setting is very important to attend and that I will send in a week supply to the local pharmacy.

## 2019-11-26 NOTE — Telephone Encounter (Signed)
-----   Message from Lars Masson, MD sent at 11/25/2019  8:16 PM EST ----- Is he taking his atorvastatin? If not he should start and repeat lipids in 4 weeks, if yes, he =needs to be referred to the lipid clinic.

## 2019-11-26 NOTE — Telephone Encounter (Signed)
-----   Message from Loa Socks, LPN sent at 01/25/6143 11:18 AM EST ----- Regarding: pt needs warfarin refilled and appt Was talking to pt earlier about his recent lab results and he mentioned he needs a refill of his Warfarin and may need an appt. Informed him I would make contact with you guys to reach out to him and arrange this.  He states he is able to talk anytime today, when you get a chance to call.    Thanks so much, [employer]

## 2019-12-04 ENCOUNTER — Ambulatory Visit (INDEPENDENT_AMBULATORY_CARE_PROVIDER_SITE_OTHER): Payer: 59

## 2019-12-04 ENCOUNTER — Other Ambulatory Visit: Payer: Self-pay

## 2019-12-04 DIAGNOSIS — Z5181 Encounter for therapeutic drug level monitoring: Secondary | ICD-10-CM

## 2019-12-04 DIAGNOSIS — Z952 Presence of prosthetic heart valve: Secondary | ICD-10-CM

## 2019-12-04 LAB — POCT INR: INR: 2.7 (ref 2.0–3.0)

## 2019-12-04 MED ORDER — WARFARIN SODIUM 6 MG PO TABS: ORAL_TABLET | ORAL | 0 refills | Status: DC

## 2019-12-04 MED FILL — WARFARIN SODIUM 6 MG TABLET: 6 | 30 days supply | Qty: 65 | Fill #0

## 2019-12-04 NOTE — Patient Instructions (Signed)
Description   Take 3 tablets today since you missed yesterday's dosage of Coumadin, then resume same dosage 2 tablets daily except 3 tablets on Tuesdays. Recheck INR in 4 weeks. Coumadin Clinic [phone] Main [phone]

## 2019-12-08 MED FILL — ATORVASTATIN 80 MG TABLET: 80 | 90 days supply | Qty: 90 | Fill #1

## 2019-12-24 ENCOUNTER — Ambulatory Visit (INDEPENDENT_AMBULATORY_CARE_PROVIDER_SITE_OTHER): Payer: 59

## 2019-12-24 ENCOUNTER — Other Ambulatory Visit: Payer: 59 | Admitting: *Deleted

## 2019-12-24 ENCOUNTER — Other Ambulatory Visit: Payer: Self-pay

## 2019-12-24 DIAGNOSIS — E782 Mixed hyperlipidemia: Secondary | ICD-10-CM

## 2019-12-24 DIAGNOSIS — Z952 Presence of prosthetic heart valve: Secondary | ICD-10-CM | POA: Diagnosis not present

## 2019-12-24 DIAGNOSIS — I214 Non-ST elevation (NSTEMI) myocardial infarction: Secondary | ICD-10-CM | POA: Diagnosis not present

## 2019-12-24 DIAGNOSIS — E785 Hyperlipidemia, unspecified: Secondary | ICD-10-CM

## 2019-12-24 DIAGNOSIS — Z5181 Encounter for therapeutic drug level monitoring: Secondary | ICD-10-CM

## 2019-12-24 LAB — LIPID PANEL
Chol/HDL Ratio: 3.9 ratio (ref 0.0–5.0)
Cholesterol, Total: 153 mg/dL (ref 100–199)
HDL: 39 mg/dL — ABNORMAL LOW (ref >39)
LDL Chol Calc (NIH): 91 mg/dL (ref 0–99)
Triglycerides: 130 mg/dL (ref 0–149)
VLDL Cholesterol Cal: 23 mg/dL (ref 5–40)

## 2019-12-24 LAB — POCT INR: INR: 4 — AB (ref 2.0–3.0)

## 2019-12-24 NOTE — Patient Instructions (Signed)
Description   Skip today's dosage of Coumadin, then resume same dosage 2 tablets daily except 3 tablets on Tuesdays. Recheck INR in 3 weeks. Coumadin Clinic [phone] Main [phone]    

## 2019-12-30 ENCOUNTER — Telehealth: Payer: Self-pay | Admitting: Cardiology

## 2019-12-30 ENCOUNTER — Telehealth: Payer: Self-pay | Admitting: *Deleted

## 2019-12-30 DIAGNOSIS — Z952 Presence of prosthetic heart valve: Secondary | ICD-10-CM

## 2019-12-30 DIAGNOSIS — I214 Non-ST elevation (NSTEMI) myocardial infarction: Secondary | ICD-10-CM

## 2019-12-30 DIAGNOSIS — Z5181 Encounter for therapeutic drug level monitoring: Secondary | ICD-10-CM

## 2019-12-30 DIAGNOSIS — E785 Hyperlipidemia, unspecified: Secondary | ICD-10-CM

## 2019-12-30 NOTE — Telephone Encounter (Signed)
New message  Patient returning call about lab results. Please give patient a call back.  

## 2019-12-30 NOTE — Telephone Encounter (Signed)
-----   Message from Lars Masson, MD sent at 12/30/2019 10:37 AM EDT ----- Let us repeat the test lipids in 2 months.    Spoke with the pt and informed him that I spoke back with Dr. Delton See.  She said he should continue his current regimen of atorvastatin and Lovaza, with no missed doses, and come back in 2 months to have his lipids rechecked.  Reiterated to the pt the importance of being compliant with taking his cardiac medications. Scheduled the pt to come back in, in 2 months to have his lipids rechecked.  Pt is aware to come fasting.  Pt verbalized understanding and agrees with this plan.

## 2019-12-30 NOTE — Telephone Encounter (Signed)
-----   Message from Lars Masson, MD sent at 12/30/2019 10:37 AM EDT ----- Let us repeat the test lipids in 2 months. ----- Message ----- From: Loa Socks, LPN Sent: 03/31/4127  10:00 AM EDT To: Lars Masson, MD  Review and advise:  I sent you a telephone note in regards to this as well.     ----- Message ----- From: Quintella Reichert, MD Sent: 12/24/2019   4:57 PM EDT To: Loa Socks, LPN  LDL not at goal which is< 70.  On max dose Atorva at 80mg  daily and Lovaza.  Add Zetia 10mg  daily and repeat FLP and ALT in 6 weeks

## 2019-12-30 NOTE — Telephone Encounter (Signed)
-----   Message from Quintella Reichert, MD sent at 12/24/2019  4:57 PM EDT ----- LDL not at goal which is< 70.  On max dose Atorva at 80mg  daily and Lovaza.  Add Zetia 10mg  daily and repeat FLP and ALT in 6 weeks

## 2019-12-30 NOTE — Telephone Encounter (Signed)
   12/30/19 10:00 AM Note   Endorsed to the pt his lab results and recommendations per Dr. Mayford Knife, who was covering Dr. Delton See last week. Pt states that he wanted Dr. Delton See know that he honestly did NOT start taking his atorvastatin 80 mg po daily and his Lovaza, until the week before this lab appointment was done.  Pt wants Dr. Delton See to advise if he should just continue on that regimen, for he states he is now being compliant and taking both as prescribed, or proceed with recommendations from Dr. Mayford Knife, for him to add Zetia to his regimen. Informed the pt that I will route this message to Dr. Delton See to further review and advise on, and follow-up with him shortly thereafter.  Pt verbalized understanding and agrees with this plan.           Spoke with the pt and informed him that I spoke back with Dr. Delton See.  She said he should continue his current regimen of atorvastatin and Lovaza, with no missed doses, and come back in 2 months to have his lipids rechecked.  Reiterated to the pt the importance of being compliant with taking his cardiac medications. Scheduled the pt to come back in, in 2 months to have his lipids rechecked. Pt will come in for lab on 02/29/20.  Pt is aware to come fasting.  Pt verbalized understanding and agrees with this plan.

## 2019-12-30 NOTE — Telephone Encounter (Signed)
Endorsed to the pt his lab results and recommendations per Dr. Mayford Knife, who was covering Dr. Delton See last week. Pt states that he wanted Dr. Delton See know that he honestly did NOT start taking his atorvastatin 80 mg po daily and his Lovaza, until the week before this lab appointment was done.  Pt wants Dr. Delton See to advise if he should just continue on that regimen, for he states he is now being compliant and taking both as prescribed, or proceed with recommendations from Dr. Mayford Knife, for him to add Zetia to his regimen. Informed the pt that I will route this message to Dr. Delton See to further review and advise on, and follow-up with him shortly thereafter.  Pt verbalized understanding and agrees with this plan.

## 2020-01-25 ENCOUNTER — Telehealth: Payer: Self-pay | Admitting: *Deleted

## 2020-01-25 NOTE — Telephone Encounter (Signed)
Left message for pt to call back regarding scheduling an appt to have INR checked. Will await a call back from pt.

## 2020-02-02 ENCOUNTER — Other Ambulatory Visit: Payer: Self-pay | Admitting: Cardiology

## 2020-02-02 NOTE — Telephone Encounter (Signed)
Overdue for an appointment to get INR checked.

## 2020-02-03 NOTE — Telephone Encounter (Signed)
Pt is overdue to get INR checked. Called and spoke to pt who stated that he has been out of warfarin for a couple of days. Instructed pt to take an extra 1/2 tablet of warfarin with his normal dose today and tomorrow and that I would give him a refill for 1 week. Scheduled pt with an appointment to get INR checked next week.

## 2020-02-09 ENCOUNTER — Ambulatory Visit (INDEPENDENT_AMBULATORY_CARE_PROVIDER_SITE_OTHER): Payer: 59 | Admitting: *Deleted

## 2020-02-09 ENCOUNTER — Other Ambulatory Visit: Payer: Self-pay

## 2020-02-09 DIAGNOSIS — Z5181 Encounter for therapeutic drug level monitoring: Secondary | ICD-10-CM

## 2020-02-09 DIAGNOSIS — Z952 Presence of prosthetic heart valve: Secondary | ICD-10-CM | POA: Diagnosis not present

## 2020-02-09 LAB — POCT INR: INR: 3.8 — AB (ref 2.0–3.0)

## 2020-02-09 MED ORDER — WARFARIN SODIUM 6 MG PO TABS: ORAL_TABLET | ORAL | 0 refills | Status: DC

## 2020-02-09 MED FILL — WARFARIN SODIUM 6 MG TABLET: 6 | 30 days supply | Qty: 75 | Fill #0

## 2020-02-09 NOTE — Patient Instructions (Signed)
Description   Skip today's dosage of Coumadin, then resume same dosage 2 tablets daily except 3 tablets on Tuesdays. Recheck INR in 3 weeks. [facility] [phone] Main [phone]

## 2020-02-29 ENCOUNTER — Other Ambulatory Visit: Payer: 59

## 2020-04-14 IMAGING — CR DG CHEST 2V
2 series · 2 of 2 positions shown · non-contrast
Comparison: None.

CLINICAL DATA: Chest pain

EXAM:
CHEST - 2 VIEW

[w chest pa]
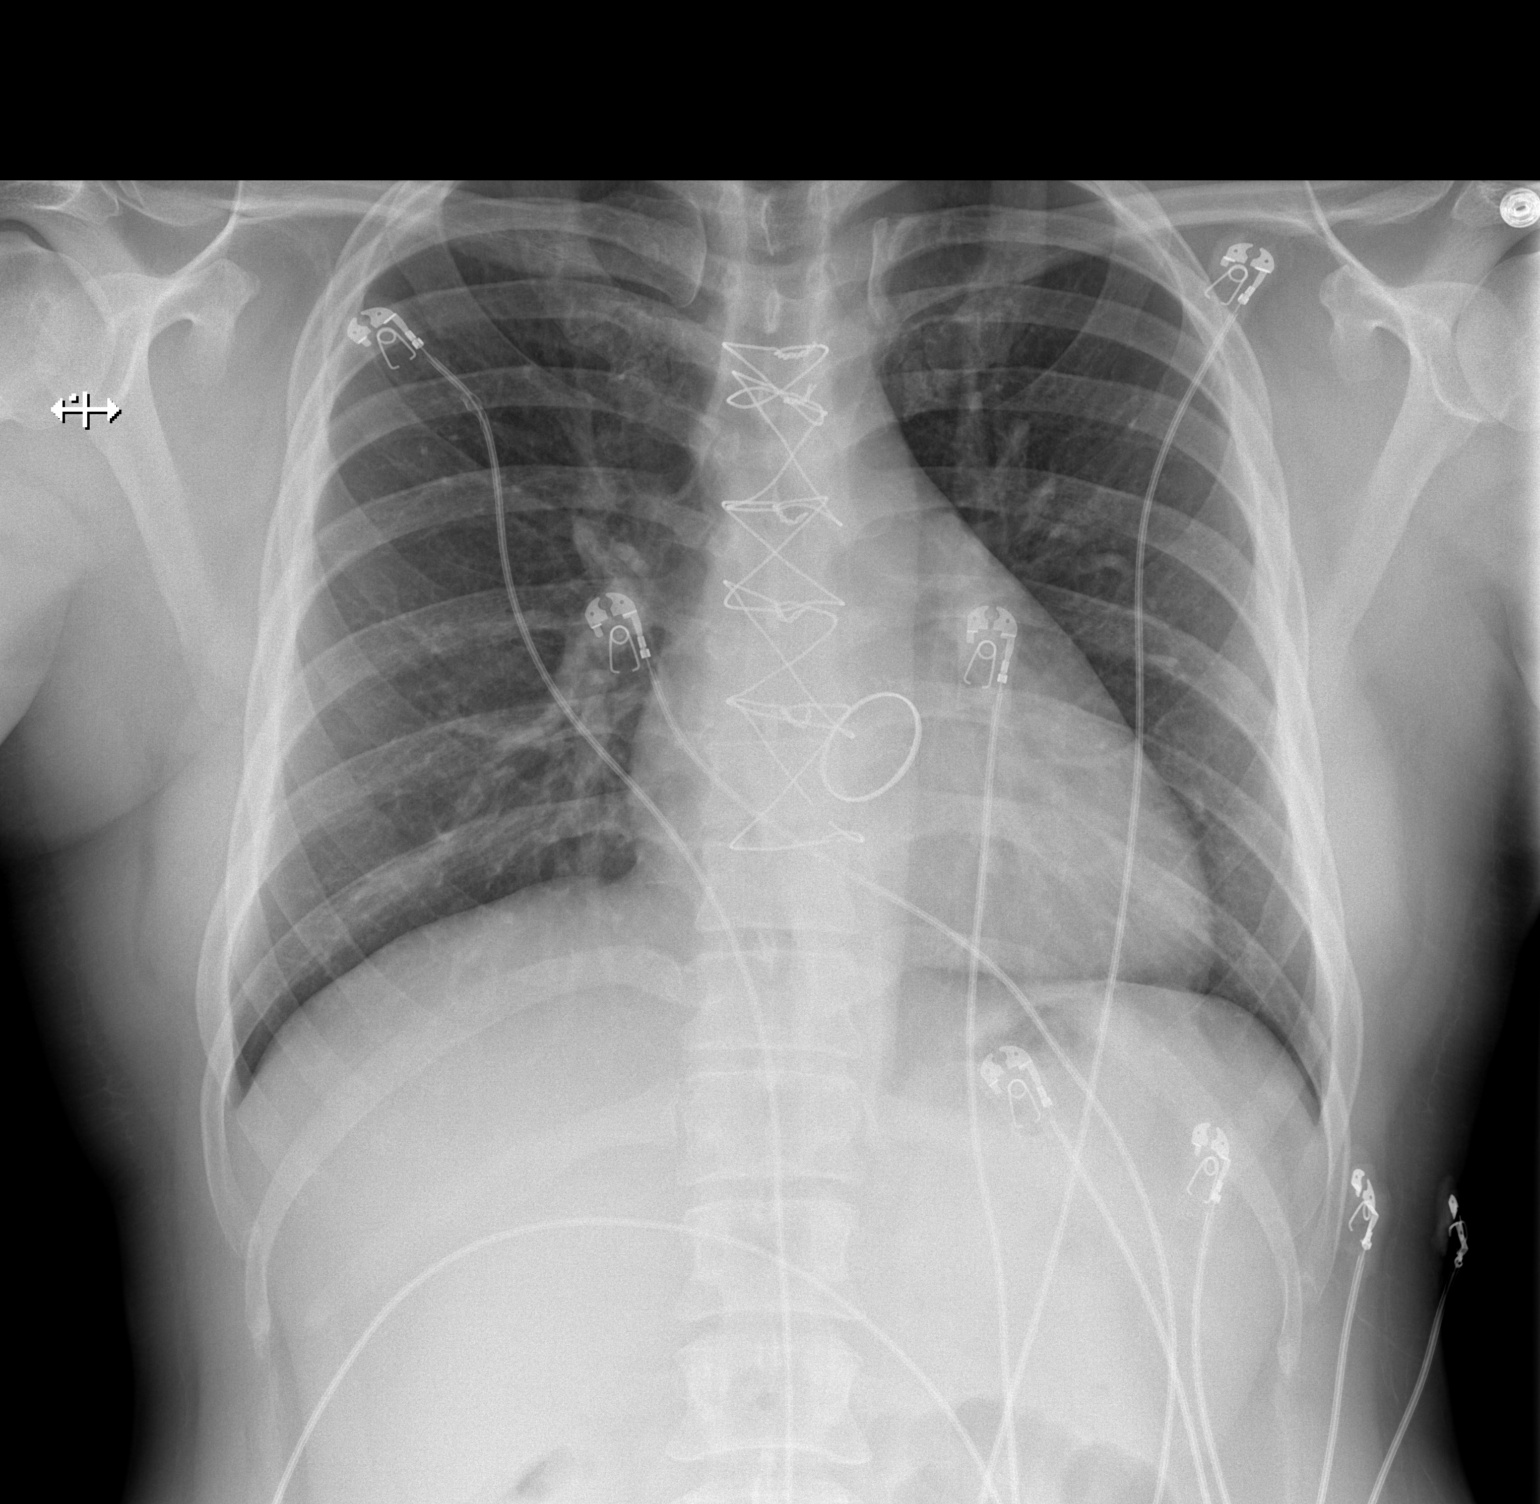

[w chest lat]
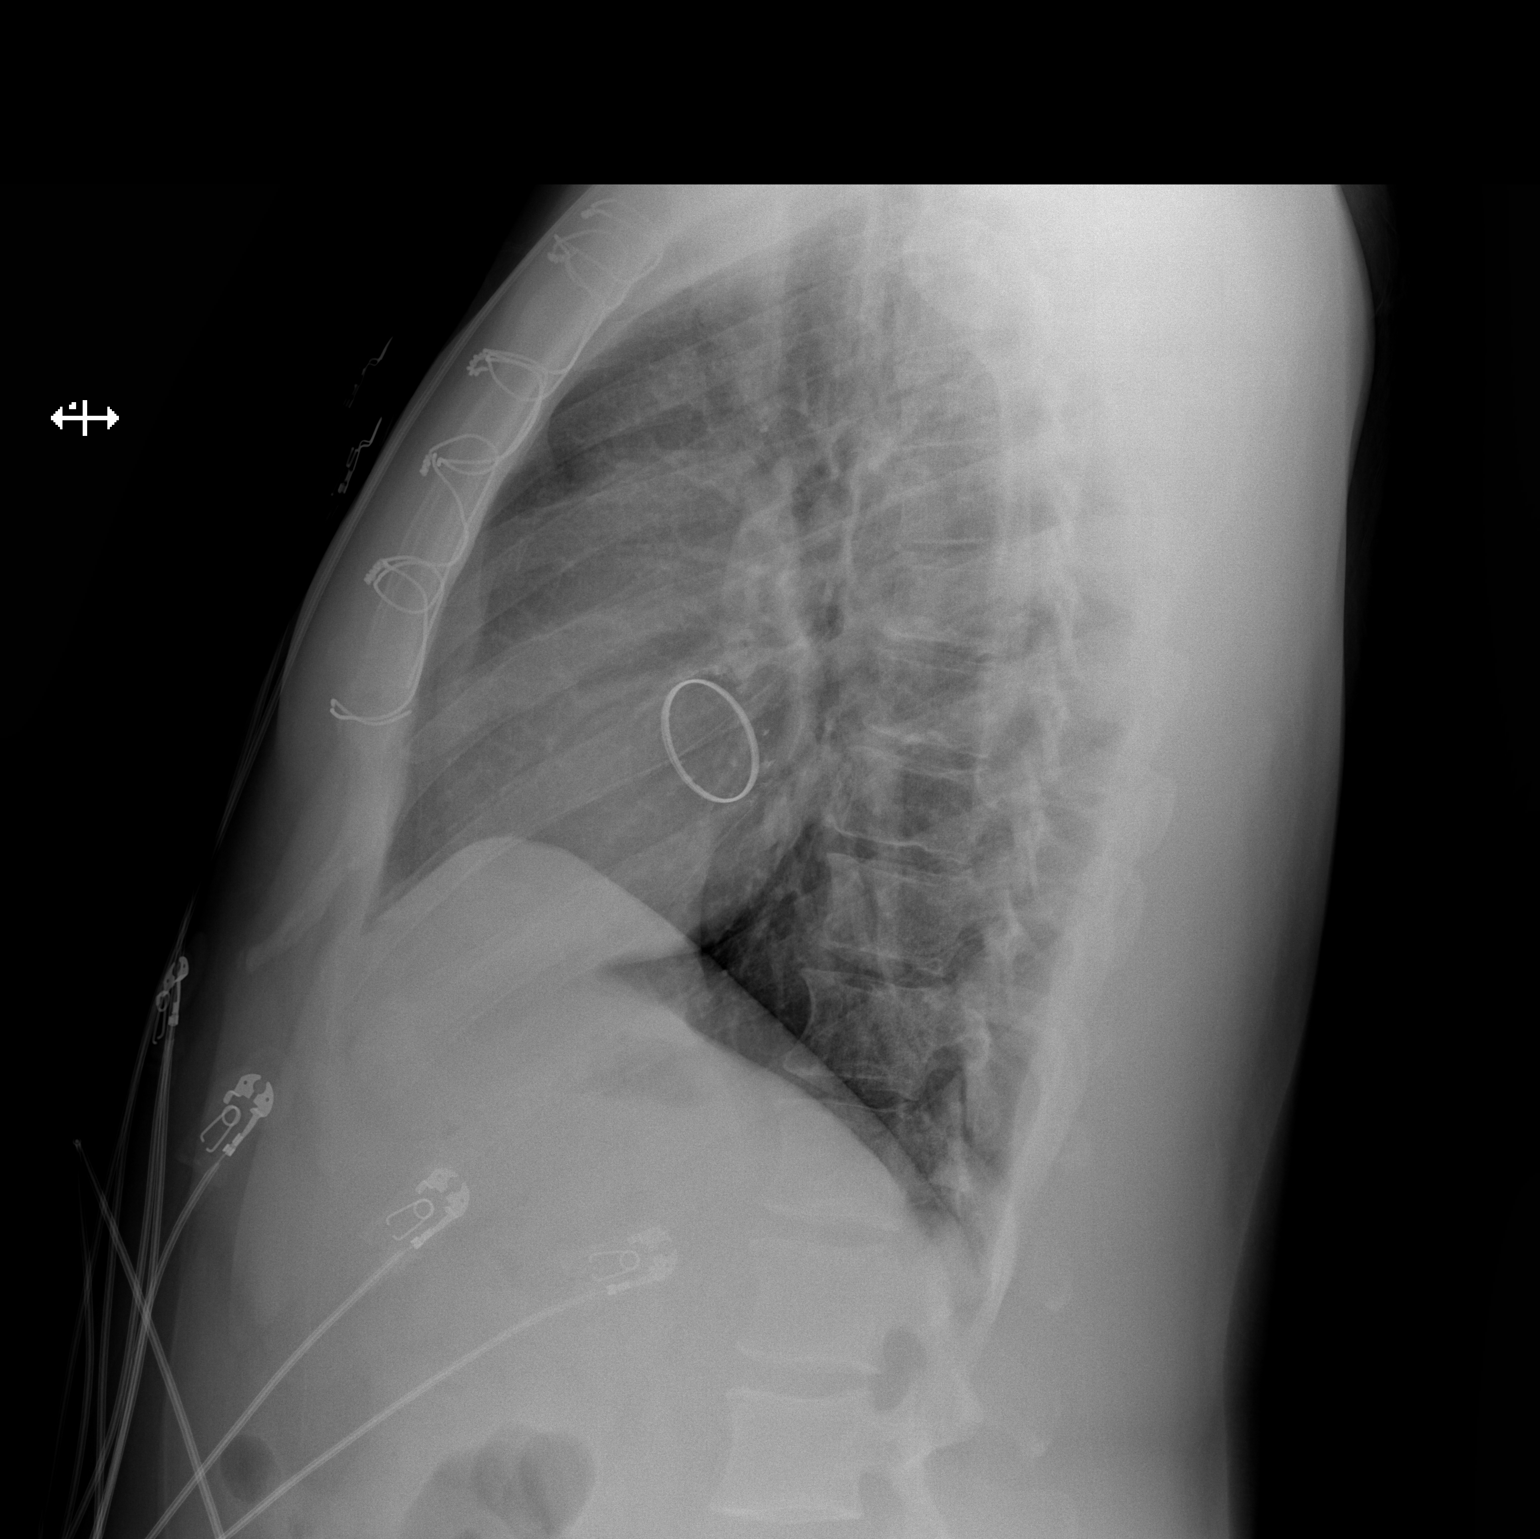

[2 of 2 positions shown; findings below may reference images not displayed]

FINDINGS: Prior mitral valve repair. Mild cardiomegaly. Lungs clear. No
effusions or acute bony abnormality.
IMPRESSION: Mild cardiomegaly.  No active disease.

## 2020-04-15 ENCOUNTER — Other Ambulatory Visit: Payer: Self-pay | Admitting: Cardiology

## 2020-04-15 MED FILL — WARFARIN SODIUM 6 MG TABLET: 6 | 10 days supply | Qty: 30 | Fill #0

## 2020-04-28 ENCOUNTER — Telehealth: Payer: Self-pay | Admitting: Pharmacist

## 2020-04-28 NOTE — Telephone Encounter (Signed)
Left message for pt. He is overdue for INR check.

## 2020-05-10 ENCOUNTER — Telehealth: Payer: Self-pay | Admitting: *Deleted

## 2020-05-10 NOTE — Telephone Encounter (Signed)
Left a message for the pt since he is overdue for INR check. Will await for a return call.

## 2020-05-16 ENCOUNTER — Telehealth: Payer: Self-pay | Admitting: *Deleted

## 2020-05-16 MED ORDER — WARFARIN SODIUM 6 MG PO TABS: ORAL_TABLET | ORAL | 0 refills | Status: DC

## 2020-05-16 MED FILL — WARFARIN SODIUM 6 MG TABLET: 6 | 7 days supply | Qty: 15 | Fill #0

## 2020-05-16 NOTE — Telephone Encounter (Signed)
Pt is overdue for an INR check; called pt and set an appt. Pt stated he missed doses and was out of meds. Advised pt of importance of taking meds as prescribed. Sent in a a week supply of Warfarin and advised to take confirmed dose daily and made a new appt for 05/23/20. Pt is aware he needs to adhere to appts and taking meds.

## 2020-05-25 ENCOUNTER — Other Ambulatory Visit: Payer: Self-pay

## 2020-05-25 ENCOUNTER — Ambulatory Visit (INDEPENDENT_AMBULATORY_CARE_PROVIDER_SITE_OTHER): Payer: 59 | Admitting: *Deleted

## 2020-05-25 DIAGNOSIS — Z5181 Encounter for therapeutic drug level monitoring: Secondary | ICD-10-CM | POA: Diagnosis not present

## 2020-05-25 DIAGNOSIS — Z952 Presence of prosthetic heart valve: Secondary | ICD-10-CM

## 2020-05-25 LAB — POCT INR: INR: 1.1 — AB (ref 2.0–3.0)

## 2020-05-25 NOTE — Patient Instructions (Signed)
Description   Take 3 tablets of warfarin today, then continue to take 2 tablets daily except for 3 tablets on Tuesdays. Recheck INR in 1 week. [facility] [phone] Main [phone]

## 2020-06-02 ENCOUNTER — Other Ambulatory Visit: Payer: Self-pay

## 2020-06-02 ENCOUNTER — Ambulatory Visit (INDEPENDENT_AMBULATORY_CARE_PROVIDER_SITE_OTHER): Payer: 59

## 2020-06-02 DIAGNOSIS — Z952 Presence of prosthetic heart valve: Secondary | ICD-10-CM | POA: Diagnosis not present

## 2020-06-02 DIAGNOSIS — Z5181 Encounter for therapeutic drug level monitoring: Secondary | ICD-10-CM

## 2020-06-02 LAB — POCT INR: INR: 2.5 (ref 2.0–3.0)

## 2020-06-02 MED ORDER — WARFARIN SODIUM 6 MG PO TABS: ORAL_TABLET | ORAL | 0 refills | Status: DC

## 2020-06-02 MED FILL — WARFARIN SODIUM 6 MG TABLET: 6 | 30 days supply | Qty: 65 | Fill #0

## 2020-06-02 NOTE — Patient Instructions (Signed)
Description   Continue on same dosage 2 tablets daily except for 3 tablets on Tuesdays. Recheck INR in 2 weeks. [facility] [phone] Main [phone]

## 2020-06-20 ENCOUNTER — Other Ambulatory Visit: Payer: Self-pay | Admitting: Cardiology

## 2020-06-21 ENCOUNTER — Other Ambulatory Visit: Payer: Self-pay | Admitting: Cardiology

## 2020-06-21 MED ORDER — WARFARIN SODIUM 6 MG PO TABS: ORAL_TABLET | ORAL | 0 refills | Status: DC

## 2020-06-21 NOTE — Telephone Encounter (Signed)
Returned call to pt, pt no showed for appt on 06/16/20, r/s to 06/22/20, cancelled appt for 06/22/20 and rescheduled to 06/30/20.  Advised pt he is overdue for follow-up.  Since he is completely out of Warfarin I will call in a 9 day supply of Warfarin to get pt to appt on 06/30/20, but he must keep that scheduled appt for future refills.  Pt verbalized understanding.  Warfarin rx sent to pharmacy.

## 2020-06-21 NOTE — Telephone Encounter (Signed)
*  STAT* If patient is at the pharmacy, call can be transferred to refill team.   1. Which medications need to be refilled? (please list name of each medication and dose if known) warfarin (COUMADIN) 6 MG tablet  2. Which pharmacy/location (including street and city if local pharmacy) is medication to be sent to? [facility] - [place], [place] - [address] [facility].  3. Do they need a 30 day or 90 day supply? 30 day   Patient states he misplaced the medication and is completely out.

## 2020-06-22 MED FILL — WARFARIN SODIUM 6 MG TABLET: 6 | 10 days supply | Qty: 22 | Fill #0

## 2020-07-07 ENCOUNTER — Telehealth: Payer: Self-pay | Admitting: *Deleted

## 2020-07-07 NOTE — Telephone Encounter (Signed)
Pt was due to come in for an INR check in 9/23 and rescheduled to 9/29 then canceled that appt, then 10/7 a 10 day supply of warfarin was sent and he was rescheduled until 10/12 appt and pt did not show up for the appt. Called the pt today since he is overdue and needs to have his INR checked; had to leave a message.  Pt needs to be reminded of how important this medication is as he could have a stroke,clot, and fatality if not taking correctly and/or bleed internally if not monitored.

## 2020-07-07 NOTE — Telephone Encounter (Signed)
Pt is overdue for his Anticoagulation Appt; he has made appts but have not showed up. Called pt today and had to leave him a message for him to call back to get scheduled.

## 2020-07-17 ENCOUNTER — Emergency Department (HOSPITAL_COMMUNITY): Payer: 59

## 2020-07-17 ENCOUNTER — Other Ambulatory Visit: Payer: Self-pay

## 2020-07-17 ENCOUNTER — Observation Stay (HOSPITAL_BASED_OUTPATIENT_CLINIC_OR_DEPARTMENT_OTHER): Payer: 59

## 2020-07-17 ENCOUNTER — Encounter (HOSPITAL_COMMUNITY): Payer: Self-pay | Admitting: Radiology

## 2020-07-17 ENCOUNTER — Observation Stay (HOSPITAL_COMMUNITY)
Admission: EM | Admit: 2020-07-17 | Discharge: 2020-07-18 | Disposition: A | Discharge status: Home / Self Care | Payer: 59 | Attending: Internal Medicine | Admitting: Internal Medicine

## 2020-07-17 DIAGNOSIS — D688 Other specified coagulation defects: Secondary | ICD-10-CM | POA: Diagnosis not present

## 2020-07-17 DIAGNOSIS — I6340 Cerebral infarction due to embolism of unspecified cerebral artery: Secondary | ICD-10-CM | POA: Diagnosis not present

## 2020-07-17 DIAGNOSIS — I25110 Atherosclerotic heart disease of native coronary artery with unstable angina pectoris: Secondary | ICD-10-CM | POA: Insufficient documentation

## 2020-07-17 DIAGNOSIS — R791 Abnormal coagulation profile: Secondary | ICD-10-CM | POA: Diagnosis not present

## 2020-07-17 DIAGNOSIS — Z79899 Other long term (current) drug therapy: Secondary | ICD-10-CM | POA: Diagnosis not present

## 2020-07-17 DIAGNOSIS — K089 Disorder of teeth and supporting structures, unspecified: Secondary | ICD-10-CM

## 2020-07-17 DIAGNOSIS — Z20822 Contact with and (suspected) exposure to covid-19: Secondary | ICD-10-CM | POA: Insufficient documentation

## 2020-07-17 DIAGNOSIS — I6389 Other cerebral infarction: Secondary | ICD-10-CM

## 2020-07-17 DIAGNOSIS — R42 Dizziness and giddiness: Secondary | ICD-10-CM | POA: Diagnosis not present

## 2020-07-17 DIAGNOSIS — Z7901 Long term (current) use of anticoagulants: Secondary | ICD-10-CM | POA: Diagnosis not present

## 2020-07-17 DIAGNOSIS — I639 Cerebral infarction, unspecified: Secondary | ICD-10-CM

## 2020-07-17 DIAGNOSIS — Z952 Presence of prosthetic heart valve: Secondary | ICD-10-CM

## 2020-07-17 DIAGNOSIS — E785 Hyperlipidemia, unspecified: Secondary | ICD-10-CM | POA: Diagnosis present

## 2020-07-17 DIAGNOSIS — Z5181 Encounter for therapeutic drug level monitoring: Secondary | ICD-10-CM

## 2020-07-17 DIAGNOSIS — I119 Hypertensive heart disease without heart failure: Secondary | ICD-10-CM | POA: Insufficient documentation

## 2020-07-17 DIAGNOSIS — R29818 Other symptoms and signs involving the nervous system: Secondary | ICD-10-CM | POA: Diagnosis not present

## 2020-07-17 DIAGNOSIS — G459 Transient cerebral ischemic attack, unspecified: Secondary | ICD-10-CM | POA: Insufficient documentation

## 2020-07-17 LAB — I-STAT CHEM 8, ED
BUN: 14 mg/dL (ref 6–20)
Calcium, Ion: 1.16 mmol/L (ref 1.15–1.40)
Chloride: 102 mmol/L (ref 98–111)
Creatinine, Ser: 1.1 mg/dL (ref 0.61–1.24)
Glucose, Bld: 85 mg/dL (ref 70–99)
HCT: 46 % (ref 39.0–52.0)
Hemoglobin: 15.6 g/dL (ref 13.0–17.0)
Potassium: 4.2 mmol/L (ref 3.5–5.1)
Sodium: 140 mmol/L (ref 135–145)
TCO2: 26 mmol/L (ref 22–32)

## 2020-07-17 LAB — COMPREHENSIVE METABOLIC PANEL
ALT: 38 U/L (ref 0–44)
AST: 45 U/L — ABNORMAL HIGH (ref 15–41)
Albumin: 4.2 g/dL (ref 3.5–5.0)
Alkaline Phosphatase: 49 U/L (ref 38–126)
Anion gap: 10 (ref 5–15)
BUN: 14 mg/dL (ref 6–20)
CO2: 25 mmol/L (ref 22–32)
Calcium: 9.3 mg/dL (ref 8.9–10.3)
Chloride: 102 mmol/L (ref 98–111)
Creatinine, Ser: 1.21 mg/dL (ref 0.61–1.24)
GFR, Estimated: >60 mL/min (ref >60)
Glucose, Bld: 87 mg/dL (ref 70–99)
Potassium: 4.8 mmol/L (ref 3.5–5.1)
Sodium: 137 mmol/L (ref 135–145)
Total Bilirubin: 1.5 mg/dL — ABNORMAL HIGH (ref 0.3–1.2)
Total Protein: 7.3 g/dL (ref 6.5–8.1)

## 2020-07-17 LAB — ECHOCARDIOGRAM COMPLETE
Area-P 1/2: 2.91 cm2
Height: 69 in
S' Lateral: 3.9 cm
Single Plane A4C EF: 43.7 %
Weight: 3520 oz

## 2020-07-17 LAB — HIV ANTIBODY (ROUTINE TESTING W REFLEX): HIV Screen 4th Generation wRfx: NONREACTIVE

## 2020-07-17 LAB — DIFFERENTIAL
Abs Immature Granulocytes: 0.01 10*3/uL (ref 0.00–0.07)
Basophils Absolute: 0 10*3/uL (ref 0.0–0.1)
Basophils Relative: 1 %
Eosinophils Absolute: 0 10*3/uL (ref 0.0–0.5)
Eosinophils Relative: 1 %
Immature Granulocytes: 0 %
Lymphocytes Relative: 34 %
Lymphs Abs: 1.4 10*3/uL (ref 0.7–4.0)
Monocytes Absolute: 0.5 10*3/uL (ref 0.1–1.0)
Monocytes Relative: 11 %
Neutro Abs: 2.2 10*3/uL (ref 1.7–7.7)
Neutrophils Relative %: 53 %

## 2020-07-17 LAB — RAPID URINE DRUG SCREEN, HOSP PERFORMED
Amphetamines: NOT DETECTED
Barbiturates: NOT DETECTED
Benzodiazepines: NOT DETECTED
Cocaine: NOT DETECTED
Opiates: NOT DETECTED
Tetrahydrocannabinol: NOT DETECTED

## 2020-07-17 LAB — LIPID PANEL
Cholesterol: 240 mg/dL — ABNORMAL HIGH (ref 0–200)
HDL: 42 mg/dL (ref >40)
LDL Cholesterol: 175 mg/dL — ABNORMAL HIGH (ref 0–99)
Total CHOL/HDL Ratio: 5.7 RATIO
Triglycerides: 117 mg/dL (ref <150)
VLDL: 23 mg/dL (ref 0–40)

## 2020-07-17 LAB — CBC
HCT: 44.2 % (ref 39.0–52.0)
Hemoglobin: 15 g/dL (ref 13.0–17.0)
MCH: 30.1 pg (ref 26.0–34.0)
MCHC: 33.9 g/dL (ref 30.0–36.0)
MCV: 88.8 fL (ref 80.0–100.0)
Platelets: 187 10*3/uL (ref 150–400)
RBC: 4.98 MIL/uL (ref 4.22–5.81)
RDW: 13.1 % (ref 11.5–15.5)
WBC: 4 10*3/uL (ref 4.0–10.5)
nRBC: 0 % (ref 0.0–0.2)

## 2020-07-17 LAB — RESPIRATORY PANEL BY RT PCR (FLU A&B, COVID)
Influenza A by PCR: NEGATIVE
Influenza B by PCR: NEGATIVE
SARS Coronavirus 2 by RT PCR: NEGATIVE

## 2020-07-17 LAB — URINALYSIS, ROUTINE W REFLEX MICROSCOPIC
Bilirubin Urine: NEGATIVE
Glucose, UA: NEGATIVE mg/dL
Hgb urine dipstick: NEGATIVE
Ketones, ur: NEGATIVE mg/dL
Leukocytes,Ua: NEGATIVE
Nitrite: NEGATIVE
Protein, ur: NEGATIVE mg/dL
Specific Gravity, Urine: >1.046 — ABNORMAL HIGH (ref 1.005–1.030)
pH: 7 (ref 5.0–8.0)

## 2020-07-17 LAB — ETHANOL: Alcohol, Ethyl (B): <10 mg/dL (ref <10)

## 2020-07-17 LAB — PROTIME-INR
INR: 1.1 (ref 0.8–1.2)
Prothrombin Time: 13.4 seconds (ref 11.4–15.2)

## 2020-07-17 LAB — TROPONIN I (HIGH SENSITIVITY)
Troponin I (High Sensitivity): 6 ng/L (ref <18)
Troponin I (High Sensitivity): 7 ng/L (ref <18)

## 2020-07-17 LAB — CBG MONITORING, ED: Glucose-Capillary: 79 mg/dL (ref 70–99)

## 2020-07-17 LAB — APTT: aPTT: 32 seconds (ref 24–36)

## 2020-07-17 MED ORDER — WARFARIN SODIUM 10 MG PO TABS
10.0000 mg | ORAL_TABLET | Freq: Once | ORAL | Status: AC
  Administered 2020-07-17: 10 mg via ORAL
  Filled 2020-07-17 (×2): qty 1

## 2020-07-17 MED ORDER — IOHEXOL 350 MG/ML SOLN
65.0000 mL | Freq: Once | INTRAVENOUS | Status: AC | PRN
  Administered 2020-07-17: 65 mL via INTRAVENOUS

## 2020-07-17 MED ORDER — HYDROCOD POLST-CPM POLST ER 10-8 MG/5ML PO SUER: 5.0000 mL | Freq: Two times a day (BID) | ORAL | Status: DC | PRN

## 2020-07-17 MED ORDER — WARFARIN - PHARMACIST DOSING INPATIENT: Freq: Every day | Status: DC

## 2020-07-17 MED ORDER — ENOXAPARIN SODIUM 100 MG/ML ~~LOC~~ SOLN
100.0000 mg | Freq: Two times a day (BID) | SUBCUTANEOUS | Status: DC
  Administered 2020-07-17 – 2020-07-18 (×2): 100 mg via SUBCUTANEOUS
  Filled 2020-07-17 (×2): qty 1

## 2020-07-17 MED ORDER — POLYETHYLENE GLYCOL 3350 17 G PO PACK: 17.0000 g | PACK | Freq: Every day | ORAL | Status: DC | PRN

## 2020-07-17 MED ORDER — WARFARIN SODIUM 6 MG PO TABS: 12.0000 mg | ORAL_TABLET | ORAL | Status: DC

## 2020-07-17 MED ORDER — ONDANSETRON HCL 4 MG/2ML IJ SOLN: 4.0000 mg | Freq: Four times a day (QID) | INTRAMUSCULAR | Status: DC | PRN

## 2020-07-17 MED ORDER — ACETAMINOPHEN 650 MG RE SUPP: 650.0000 mg | Freq: Four times a day (QID) | RECTAL | Status: DC | PRN

## 2020-07-17 MED ORDER — ONDANSETRON HCL 4 MG PO TABS: 4.0000 mg | ORAL_TABLET | Freq: Four times a day (QID) | ORAL | Status: DC | PRN

## 2020-07-17 MED ORDER — ATORVASTATIN CALCIUM 80 MG PO TABS
80.0000 mg | ORAL_TABLET | Freq: Every day | ORAL | Status: DC
  Administered 2020-07-17 – 2020-07-18 (×2): 80 mg via ORAL
  Filled 2020-07-17 (×2): qty 1

## 2020-07-17 MED ORDER — ACETAMINOPHEN 325 MG PO TABS: 650.0000 mg | ORAL_TABLET | Freq: Four times a day (QID) | ORAL | Status: DC | PRN

## 2020-07-17 NOTE — Consult Note (Addendum)
Neurology Consultation  Reason for Consult: Dizziness and blurred vision Referring Physician: Dr. Stevie Kern  CC: Dizziness and blurred vision  History is obtained from: Patient and chart  HPI: Benjamin Rodriguez is a 34 y.o. male past medical history of a recent MI, mitral valve replacememnt-supposed to be on Coumadin which she is probably noncompliant with, hypertension, presented to the emergency room with sudden onset of dizziness-that she describes as lightheadedness when he was walking his dog.  He woke up normal this morning and sometime while walking his dog was when he had a sudden onset of these symptoms.  Best last known normal is somewhere around 730 this morning. He does not report any room spinning sensation but rather lightheadedness. Also reported some blurred vision at the time. Blood pressure was in the 120s systolic. Reports not taking Coumadin for 2 weeks. Does not report any tingling numbness weakness No strokes in the past Reviewing the cardiologist note from September 2020, he has been noncompliant with his Coumadin resulting in a MI at that time as well.  He was started on aspirin Plavix and Coumadin for a month followed by aspirin and Plavix for a year and Coumadin after that.  Has been not adherent to that regimen.  LKW: 7:30 AM 07/17/2020 tpa given?: no, NIH 0 Premorbid modified Rankin scale (mRS): 0  ROS: Performed and negative except as noted in HPI  Past Medical History:  Diagnosis Date  . CAD (coronary artery disease)    a. 08/06/18- LHC- mechanical valve thromboembolic subtotal occlusion of distal-mid LAD with successful balloon angioplasty   . History of prosthetic mitral valve 2016  . Hypertension    Family History  Problem Relation Age of Onset  . Hypertension Father   . Heart attack Father   . Breast cancer Maternal Grandmother   . Aneurysm Maternal Grandmother   . Clotting disorder Neg Hx   . Anesthesia problems Neg Hx    Social History:    reports that he has never smoked. He has never used smokeless tobacco. He reports current alcohol use. He reports that he does not use drugs.  Medications No current facility-administered medications for this encounter.  Current Outpatient Medications:  .  atorvastatin (LIPITOR) 80 MG tablet, Take 1 tablet (80 mg total) by mouth daily., Disp: 90 tablet, Rfl: 3 .  clopidogrel (PLAVIX) 75 MG tablet, Take 1 tablet (75 mg total) by mouth daily., Disp: 90 tablet, Rfl: 3 .  metoprolol tartrate (LOPRESSOR) 25 MG tablet, Take 1 tablet (25 mg total) by mouth 2 (two) times daily., Disp: 180 tablet, Rfl: 3 .  nitroGLYCERIN (NITROSTAT) 0.4 MG SL tablet, Place 1 tablet (0.4 mg total) under the tongue every 5 (five) minutes x 3 doses as needed for chest pain., Disp: 25 tablet, Rfl: 12 .  omega-3 acid ethyl esters (LOVAZA) 1 g capsule, Take 1 capsule (1 g total) by mouth 2 (two) times daily., Disp: 180 capsule, Rfl: 3 .  warfarin (COUMADIN) 6 MG tablet, Take 2 tablets daily except 3 tablets on Tuesdays or as directed by Anticoagulation Clinic.  Must keep OV for future refills, overdue for follow-up., Disp: 22 tablet, Rfl: 0 Not taking Coumadin or Plavix  Exam: Current vital signs: BP 121/79   Pulse 71   Temp 97.9 F (36.6 C) (Oral)   Resp 18   Ht 5\' 9"  (1.753 m)   Wt 99.8 kg   SpO2 97%   BMI 32.49 kg/m  Vital signs in last 24 hours: Temp:  [97.9  F (36.6 C)] 97.9 F (36.6 C) (10/24 0944) Pulse Rate:  [71-73] 71 (10/24 0945) Resp:  [14-18] 18 (10/24 0945) BP: (121-128)/(79) 121/79 (10/24 0945) SpO2:  [97 %-98 %] 97 % (10/24 0945) Weight:  [99.8 kg] 99.8 kg (10/24 0944) GENERAL: Awake, alert in NAD HEENT: - Normocephalic and atraumatic, dry mm, no LN++, no Thyromegally LUNGS - Clear to auscultation bilaterally with no wheezes CV - S1S2 RRR, no m/r/g, equal pulses bilaterally. ABDOMEN - Soft, nontender, nondistended with normoactive BS Ext: warm, well perfused, intact peripheral pulses, _no  edema  NEURO:  Mental Status: AA&Ox3  Language: speech is clear.  Naming, repetition, fluency, and comprehension intact. Cranial Nerves: PERRL. EOMI, visual fields full, no facial asymmetry, facial sensation intact, hearing intact, tongue/uvula/soft palate midline, normal sternocleidomastoid and trapezius muscle strength. No evidence of tongue atrophy or fibrillations Motor: 5/5 in all fours without drift Tone: is normal and bulk is normal Sensation- Intact to light touch bilaterally Coordination: FTN intact bilaterally Gait- deferred  NIHSS-0   Labs I have reviewed labs in epic and the results pertinent to this consultation are: CBC    Component Value Date/Time   WBC 4.7 11/25/2019 0819   WBC 6.3 08/07/2018 0055   RBC 4.94 11/25/2019 0819   RBC 4.81 08/07/2018 0055   HGB 15.6 07/17/2020 1011   HGB 14.7 11/25/2019 0819   HCT 46.0 07/17/2020 1011   HCT 42.8 11/25/2019 0819   PLT 250 11/25/2019 0819   MCV 87 11/25/2019 0819   MCH 29.8 11/25/2019 0819   MCH 29.1 08/07/2018 0055   MCHC 34.3 11/25/2019 0819   MCHC 33.7 08/07/2018 0055   RDW 13.1 11/25/2019 0819   LYMPHSABS 1.4 08/05/2018 0833   MONOABS 0.5 08/05/2018 0833   EOSABS 0.0 08/05/2018 0833   BASOSABS 0.0 08/05/2018 0833   CMP     Component Value Date/Time   NA 140 07/17/2020 1011   NA 140 11/25/2019 0819   K 4.2 07/17/2020 1011   CL 102 07/17/2020 1011   CO2 24 11/25/2019 0819   GLUCOSE 85 07/17/2020 1011   BUN 14 07/17/2020 1011   BUN 7 11/25/2019 0819   CREATININE 1.10 07/17/2020 1011   CALCIUM 9.0 11/25/2019 0819   PROT 7.3 11/25/2019 0819   ALBUMIN 4.8 11/25/2019 0819   AST 29 11/25/2019 0819   ALT 30 11/25/2019 0819   ALKPHOS 64 11/25/2019 0819   BILITOT 0.7 11/25/2019 0819   GFRNONAA 101 11/25/2019 0819   GFRAA 117 11/25/2019 0819   Lipid Panel     Component Value Date/Time   CHOL 153 12/24/2019 0904   TRIG 130 12/24/2019 0904   HDL 39 (L) 12/24/2019 0904   CHOLHDL 3.9 12/24/2019 0904    CHOLHDL 5.7 08/07/2018 0055   VLDL 34 08/07/2018 0055   LDLCALC 91 12/24/2019 0904  INR 06/02/20 - 2.5  Imaging I have reviewed the images obtained: CT-scan of the brain-no acute changes.  No bleed. CTA head and neck no LVO, basilar patent.   Assessment: 34 year old past history of recent MI, history of mitral valve replacement was supposed to be on Coumadin and is noncompliant to medications, presented with sudden onset of dizziness/lightheadedness while walking his dog. Also concern for some nystagmus on initial examination. On my examination, his NIH stroke scale is 0. I did not appreciate much of nystagmus on my examination. He continues to be mildly dizzy at this time. NIH stroke scale 0 preclude me from using IV TPA. Also CTA head and neck  is unremarkable for any occlusion. I will take him for a stat MRI-there could be scattered embolic strokes in the posterior circulation which could be causing dizziness. His INR is 1.0. This confirms that he has been noncompliant with Coumadin.  Impression: Dizziness and lightheadedness along with blurred vision-concern for posterior circulation stroke Noncompliant to Coumadin History of mitral valve replacement History of MI  Recommendations: Stat MRI to evaluate for strokes If it shows a small stroke, I would still be reluctant for TPA but would definitely admit for further stroke work-up to include cardiac work-up. If negative for stroke, I would still consider this as a TIA given that he is at high risk for embolization and is noncompliant with his Coumadin. I would recommend echocardiogram, A1c lipid panel and frequent neurochecks along with telemetry.   We will follow imaging studies and further recommendations.  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 Also has remote left SCA territory infarcts   In either case, will need embolic work up given risk factors Admit for stroke work up as below:  Updated assessment: -TIA vs Acute/subacute  ischemic stroke-embolic likely from the valve -secondary to noncompliant anticoagulation in the setting of mitral valve replacement -Evaluate for mitral valve thrombus -No TPA due to low stroke scale-risk-benefit profile does not favor TPA at this time.  Updated recommendations -Admit to hospitalist -Telemetry monitoring -Allow for permissive hypertension for the first 24-48h - only treat PRN if SBP >220 mmHg. Blood pressures can be gradually normalized to SBP<140 upon discharge. -Echocardiogram -HgbA1c, fasting lipid panel -Frequent neuro checks -Prophylactic therapy-from a stroke prevention standpoint, given the history of mitral valve repair and need for Coumadin, he should be started back on his Coumadin. The size of the stroke is very small and I think the benefit of starting Coumadin outweighs the risk-embolization that can cause more strokes and MI. -The need for compliance to anticoagulations needs to be reiterated to this gentleman who has in the past exhibited blatant noncompliance and I am not sure if he understands the importance of being compliant to Coumadin to prevent thromboembolic events. -Atorvastatin 80 mg PO daily -Risk factor modification -PT consult, OT consult, Speech consult Discussed above plan with Dr. Stevie Kern in the ED.  Please page stroke NP/PA/MD (listed on AMION)  from 8am-4 pm as this patient will be followed by the stroke team at this point.  -- Milon Dikes, MD [facility] Pager: [phone] If 7pm to 7am, please call on call as listed on AMION.

## 2020-07-17 NOTE — ED Notes (Signed)
Pt transported to ECHO. 

## 2020-07-17 NOTE — Progress Notes (Signed)
  Echocardiogram 2D Echocardiogram has been performed.  Benjamin Rodriguez 07/17/2020, 3:26 PM

## 2020-07-17 NOTE — ED Triage Notes (Addendum)
Pt to ED via POV C/O dizziness. Reports this morning he was walking his dog, aprox at 0730 am and suddenly became dizzy with blurred vision, pt did not fall. No LOC. Pt currently c/o HA pain 3. Reports hx mitral valve replacement . A&O X 4. Currently denies feeling dizzy.  Reports non compliant with medication regimen, suppose to take coumadin, reports has not taken in two weeks.

## 2020-07-17 NOTE — H&P (Addendum)
Date: 07/17/2020               Patient Name:  Benjamin Rodriguez MRN: [id number]  DOB: October 10, 1985 Age / Sex: 34 y.o., male   PCP: Patient, No Pcp Per         Medical Service: Internal Medicine Teaching Service         Attending Physician: Dr. Gust Rung, DO    First Contact: Dr. Evie Lacks Pager: [phone]  Second Contact: Dr. 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 The patient was in his normal state of health prior to walking his dog at 0730 when he began having dizziness described as lightheadedness and dizziness. He felt off balance and was able to hold himself against cars in the parking lot. He was able to make it into his house and felt better after sitting down. He also endorsed having a left frontal headache. Feels back at baseline. Room is no longer spinning. Endorses minimal  headache and blurry vision. Walking okay now, no longer feels off balance.   No numbness or tingling. No difficulty with speech or swallowing. No problems with BM. Feels back at baseline. Room is no longer spinning. Endorses a headache and blurry vision. Walking okay now, no longer feels off balance. No numbness or tingling. No difficulty with speech or swallowing. No problems with BM.   Patient endorsed being adherent with his coumadin until 2 weeks ago when he ran out of his medications. Patient unsure as to why he had a mitral valve replacement or an MI at a young age.  Upon chart review it reveals patient had non-rheumatic mitral valve  stenosis. 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 It also appears patient has history of non-compliance with his coumadin and subtherapeutic coumadin levels.   ED Course:  Code stroke initiated by [facility]. Initial CT head and CT angio head neck were negative. MRI performed, Dr. Wilford Corner interpreted prelim as left parietal embolic stroke. Initial troponin 6. PT/INR subtherapeutic. Admitted to medicine team for further work-up.  Lab Orders     Respiratory Panel by RT PCR (Flu A&B, Covid) - Nasopharyngeal Swab     Ethanol     Protime-INR     APTT     CBC     Differential     Comprehensive metabolic panel     Urine rapid drug screen (hosp performed)     Urinalysis, Routine w reflex microscopic     HIV Antibody (routine testing w rflx)     Lipid panel     Hemoglobin A1c     Protime-INR     CBC     I-stat chem 8, ED     CBG monitoring, ED   Meds:  Current Meds  Medication Sig   warfarin (COUMADIN) 6 MG tablet Take 2 tablets daily except 3 tablets on Tuesdays or as directed by Anticoagulation Clinic.  Must keep OV for future refills,  overdue for follow-up. (Patient taking differently: Take 12-18 mg by mouth See admin instructions. 12mg  daily except 18mg  on tuesdays)   Social: Tobacco - None, never used Alcohol - Occasionaly Drug - None  Family History:  The patient's family history includes Aneurysm in his maternal grandmother; Breast cancer in his maternal grandmother; Heart attack in his father; Hypertension in his father.   Allergies: Allergies as of 07/17/2020   (No Known Allergies)   Past Medical History:  Diagnosis Date   CAD (coronary artery disease)    a. 08/06/18- [facility]- mechanical valve thromboembolic subtotal occlusion of distal-mid LAD with successful balloon angioplasty    History of prosthetic mitral valve 2016   Hypertension    Review of Systems: A complete ROS was negative except as per HPI.    Physical Exam: Blood pressure 139/66, pulse 68, temperature (!) 97 F (36.1 C), resp. rate (!) 23, height 5\' 9"  (1.753 m), weight 99.8 kg, SpO2 98 %. Physical Exam Vitals and nursing note reviewed.  Constitutional:      General: He is not in acute distress.    Appearance: Normal appearance.  HENT:     Mouth/Throat:     Comments: Observed swallowing with no difficulties Eyes:     Extraocular Movements: Extraocular movements intact.     Conjunctiva/sclera: Conjunctivae normal.     Pupils: Pupils are equal, round, and reactive to light.  Cardiovascular:     Rate and Rhythm: Normal rate and regular rhythm.     Pulses: Normal pulses.     Heart sounds: No murmur heard.  No friction rub. No gallop.      Comments: Crisp mid-systolic click Pulmonary:     Effort: Pulmonary effort is normal. No respiratory distress.  Abdominal:     General: Abdomen is flat.     Palpations: Abdomen is soft.     Tenderness: There is no abdominal tenderness.  Musculoskeletal:     Cervical back: Neck supple.     Right lower leg: No edema.     Left lower leg: No edema.  Skin:    General: Skin is warm and dry.  Neurological:     General: No focal deficit present.     Mental Status: He is alert and oriented to person, place, and time. Mental status is at baseline.     Cranial Nerves: No cranial nerve deficit.     Sensory: No sensory deficit.     Motor: No weakness.  Psychiatric:        Mood and Affect: Mood normal.        Behavior: Behavior normal.    Labs: CBC    Component Value Date/Time   WBC 4.0 07/17/2020 1004   RBC 4.98 07/17/2020 1004   HGB 15.6 07/17/2020 1011   HGB 14.7 11/25/2019 0819   HCT 46.0 07/17/2020 1011   HCT 42.8 11/25/2019 0819   PLT 187 07/17/2020 1004   PLT 250 11/25/2019 0819   MCV 88.8 07/17/2020 1004   MCV 87 11/25/2019 0819   MCH 30.1 07/17/2020 1004   MCHC 33.9 07/17/2020 1004   RDW 13.1 07/17/2020 1004   RDW 13.1 11/25/2019 0819   LYMPHSABS 1.4 07/17/2020  1004   MONOABS 0.5 07/17/2020 1004   EOSABS 0.0 07/17/2020 1004   BASOSABS 0.0 07/17/2020 1004    CMP     Component Value Date/Time   NA 140 07/17/2020 1011   NA 140 11/25/2019 0819   K 4.2 07/17/2020 1011   CL 102 07/17/2020 1011  CO2 25 07/17/2020 1004   GLUCOSE 85 07/17/2020 1011   BUN 14 07/17/2020 1011   BUN 7 11/25/2019 0819   CREATININE 1.10 07/17/2020 1011   CALCIUM 9.3 07/17/2020 1004   PROT 7.3 07/17/2020 1004   PROT 7.3 11/25/2019 0819   ALBUMIN 4.2 07/17/2020 1004   ALBUMIN 4.8 11/25/2019 0819   AST 45 (H) 07/17/2020 1004   ALT 38 07/17/2020 1004   ALKPHOS 49 07/17/2020 1004   BILITOT 1.5 (H) 07/17/2020 1004   BILITOT 0.7 11/25/2019 0819   GFRNONAA >60 07/17/2020 1004   GFRAA 117 11/25/2019 0819   Imaging: CT Code Stroke CTA Head W/WO contrast  Result Date: 07/17/2020 CLINICAL DATA:  Dizziness. History of mitral bowel prosthesis with noncompliant anticoagulation EXAM: CT ANGIOGRAPHY HEAD AND NECK TECHNIQUE: Multidetector CT imaging of the head and neck was performed using the standard protocol during bolus administration of intravenous contrast. Multiplanar CT image reconstructions and MIPs were obtained to evaluate the vascular anatomy. Carotid stenosis measurements (when applicable) are obtained utilizing NASCET criteria, using the distal internal carotid diameter as the denominator. CONTRAST:  80mL OMNIPAQUE IOHEXOL 350 MG/ML SOLN COMPARISON:  Preceding head CT. FINDINGS: CTA NECK FINDINGS Aortic arch: 2 vessel branching.  No acute finding. Right carotid system: Vessels are smooth and widely patent. Left carotid system: Vessels are smooth and widely patent. Vertebral arteries: Strong left vertebral artery dominance. No subclavian stenosis. No dissection, beading, or stenosis. Skeleton: No acute or aggressive finding. Periodontal erosion associated with the lower left second molar. There are bilateral molar cavities. Other neck: 19 mm left thyroid nodule which could  be a cyst with thin septation Upper chest: No acute finding Review of the MIP images confirms the above findings CTA HEAD FINDINGS Anterior circulation: Vessels are smooth and widely patent. No aneurysm. Posterior circulation: Strong left vertebral artery dominance. The right vertebral artery service a tiny right PICA. The vertebral and basilar arteries are smooth and diffusely patent. No PCA branch occlusion or aneurysm. Venous sinuses: Patent as permitted by contrast timing Anatomic variants: None significant Review of the MIP images confirms the above findings IMPRESSION: 1. No emergent finding or arterial stenosis. 2. 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 Electronically Signed   By: Marnee Spring M.D.   On: 07/17/2020 10:49   CT Code Stroke CTA Neck W/WO contrast  Result Date: 07/17/2020 CLINICAL DATA:  Dizziness. History of mitral bowel prosthesis with noncompliant anticoagulation EXAM: CT ANGIOGRAPHY HEAD AND NECK TECHNIQUE: Multidetector CT imaging of the head and neck was performed using the standard protocol during bolus administration of intravenous contrast. Multiplanar CT image reconstructions and MIPs were obtained to evaluate the vascular anatomy. Carotid stenosis measurements (when applicable) are obtained utilizing NASCET criteria, using the distal internal carotid diameter as the denominator. CONTRAST:  69mL OMNIPAQUE IOHEXOL 350 MG/ML SOLN COMPARISON:  Preceding head CT. FINDINGS: CTA NECK FINDINGS Aortic arch: 2 vessel branching.  No acute finding. Right carotid system: Vessels are smooth and widely patent. Left carotid system: Vessels are smooth and widely patent. Vertebral arteries: Strong left vertebral artery dominance. No subclavian stenosis. No dissection, beading, or stenosis. Skeleton: No acute or aggressive finding. Periodontal erosion associated with the lower left second molar. There are bilateral molar cavities. Other neck: 19 mm left thyroid  nodule which could be a cyst with thin septation Upper chest: No acute finding Review of the MIP images confirms the above findings CTA HEAD FINDINGS Anterior circulation: Vessels are smooth and widely patent.  No aneurysm. Posterior circulation: Strong left vertebral artery dominance. The right vertebral artery service a tiny right PICA. The vertebral and basilar arteries are smooth and diffusely patent. No PCA branch occlusion or aneurysm. Venous sinuses: Patent as permitted by contrast timing Anatomic variants: None significant Review of the MIP images confirms the above findings IMPRESSION: 1. No emergent finding or arterial stenosis. 2. 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 Electronically Signed   By: Marnee Spring M.D.   On: 07/17/2020 10:49   MR BRAIN WO CONTRAST  Result Date: 07/17/2020 CLINICAL DATA:  Acute neuro deficit with stroke suspected EXAM: MRI HEAD WITHOUT CONTRAST TECHNIQUE: Multiplanar, multiecho pulse sequences of the brain and surrounding structures were obtained without intravenous contrast. COMPARISON:  CTA head neck from earlier today.  01/29/2016 brain MRI FINDINGS: Brain: Cluster of remote superior cerebellar infarcts. Small remote left parietal cortex infarct. Although there is some intermediate diffusion signal about the infarct, the appearance is overall chronic by CT and MRI and some of this diffusion signal may be related to chronic blood products at this level. A few additional areas of remote microhemorrhage in the peripheral left cerebrum, possibly from prior embolic events. No hydrocephalus, collection, or masslike finding. Vascular: Preserved flow voids.  There was preceding CTA. Skull and upper cervical spine: Normal marrow signal. Sinuses/Orbits: Bilateral posterior ethmoid sinus opacification. IMPRESSION: 1. No acute infarct. 2. 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 Electronically Signed   By: Marnee Spring M.D.   On: 07/17/2020 11:26   DG Chest Portable 1 View  Result Date: 07/17/2020 CLINICAL DATA:  Dizziness with blurred vision EXAM: PORTABLE CHEST 1 VIEW COMPARISON:  Chest x-ray of 08/05/2018 FINDINGS: Trachea midline. Cardiomediastinal contours accentuated by portable technique, top normal size. Signs of mitral valve replacement as before. Hilar structures are normal. Lungs are clear. No sign of consolidation. No sign of pleural effusion. No acute skeletal process to the extent evaluated IMPRESSION: No acute cardiopulmonary disease. Electronically Signed   By: Donzetta Kohut M.D.   On: 07/17/2020 12:09   CT HEAD CODE STROKE WO CONTRAST  Result Date: 07/17/2020 CLINICAL DATA:  Code stroke.  Dizziness EXAM: CT HEAD WITHOUT CONTRAST TECHNIQUE: Contiguous axial images were obtained from the base of the skull through the vertex without intravenous contrast. COMPARISON:  01/29/2016 brain MRI FINDINGS: Brain: Bifrontal gray-white differentiation is poor relative to the remainder of the brain but attributed to artifact based on reformats. Small remote left parietal cortex infarct. No evidence of hemorrhage, hydrocephalus, or masslike finding Vascular: Prominent density of the basilar but not certainly hyperdense when compared to the carotid arteries. Skull: Negative Sinuses/Orbits: Negative Other: These results were communicated to Dr. Wilford Corner at 10:22 amon 10/24/2021by text page via the [facility] messaging system. ASPECTS [facility] Stroke Program Early CT Score) Not scored with this history IMPRESSION: 1. No acute finding. 2. Small remote left parietal infarct that is new from a 2017 brain MRI. Electronically Signed   By: Marnee Spring M.D.   On: 07/17/2020 10:39   EKG: personally reviewed my interpretation is rate of 75, sinus rhythm. p waves present. Normal axis. Widened t waves. q waves present v1. No changes compared to prior from 06/10/19.   Assessment & Plan by Problem: Active Problems:    Dizziness  Benjamin Rodriguez is a 34 y.o. 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 Unremarkable neurological exam, however patient did not ambulate during examination. He endorsed no difficulties ambulating. CT revealed new left parietal infarct. MRI DWI sequence shows increase signal in posterior left parietal lobe however without corresponding ADC hypointense signal, low probability for acute process. MRI also revealed small remote left superior cerebellar infarct. Symptoms appear to be consistent with posterior infarction. Included in differential is vertigo as well.   Suspected source is emboli from patient's mechanical prosthetic mitral valve replacement in context of non-compliance with coumadin and subtherapeutic INR levels. Patient with history of noncompliance leading to MI from emboli originating from prosthetic valve. Admitted to hospital team for further evaluation/workup. Neurology/Stroke Team following. Per neuro no TPA due to low stroke-scale risk benefit profile not favoring TPA at this time.  -neuro following,appreciate their recs.   -echocardiogram pending, evaluate for potential mitral valve vegetation  -A1c, lipid panel  -frequent neurochecks  -continue coumadin, bridged with lovenox  -atorvastatin 80 mg PO daily  -PT/OT/Speech consult  -NPO until cleared by speech  -allow permissive hypertension, 24-48 hrs, treat PRN if SBP >220 mmhg  -normalize pressures upon discharge  Modified Rankin Score For Neurological Disability:  0  Diet: NPO VTE: lovenox IVF: None,None Code: Full  Prior to Admission Living Arrangement: Home Anticipated Discharge Location: Home Barriers to Discharge: TIA/CVA Workup  Dispo: Admit patient to Observation with expected length of stay less than 2 midnights.  Signed: Belva Agee, MD Internal Medicine Resident PGY-1 Pager: [phone]  07/17/2020, 3:01 PM

## 2020-07-17 NOTE — ED Notes (Signed)
Pt moved from CT to MRI per neurologist direction. MRI in progress. Pt reports NAD

## 2020-07-17 NOTE — Progress Notes (Signed)
Pt arrived from endo to 5C12 w/transporter at bedside. Pt GCS 15 denies, cp/sob,visual disturbance or dizziness, endorses lightheadness

## 2020-07-17 NOTE — ED Provider Notes (Signed)
[facility] DEPARTMENT Provider Note   CSN: [id number] Arrival date & time: 07/17/20  0930  An emergency department physician performed an initial assessment on this suspected stroke patient at 1007.  History Chief Complaint  Patient presents with  . Blurred Vision  . Dizziness    Benjamin Rodriguez is a 34 y.o. male history of CAD, MI, mitral valve replacement, noncompliant with his Coumadin therapeutic for the last 2 weeks.  Patient was walking his dog this morning 7 AM when he had sudden onset blurry vision right worse than left and dizziness described as a spinning sensation.  Symptoms have gradually improved with time; dizziness resolved still mild blurred vision, denies similar in the past.  Denies fever/chills, fall/injury, chest pain/shortness of breath, abdominal pain, nausea/vomiting, diarrhea, numbness/weakness, tingling, difficulty speaking, confusion, loss of consciousness, neck pain or any additional concerns.  HPI     Past Medical History:  Diagnosis Date  . CAD (coronary artery disease)    a. 08/06/18- LHC- mechanical valve thromboembolic subtotal occlusion of distal-mid LAD with successful balloon angioplasty   . History of prosthetic mitral valve 2016  . Hypertension     Patient Active Problem List   Diagnosis Date Noted  . Encounter for therapeutic drug monitoring 08/12/2018  . H/O mitral valve replacement with mechanical valve 08/07/2018  . Anticoagulant long-term use 08/07/2018  . Non-ST elevation (NSTEMI) myocardial infarction ([place])   . Unstable angina ([place]) 08/05/2018    Past Surgical History:  Procedure Laterality Date  . CARDIAC SURGERY    . CORONARY BALLOON ANGIOPLASTY N/A 08/06/2018   Procedure: CORONARY BALLOON ANGIOPLASTY;  Surgeon: Leonie Man, MD;  Location: [facility];  Service: Cardiovascular;  Laterality: N/A;  . LEFT HEART CATH AND CORONARY ANGIOGRAPHY N/A 08/06/2018   Procedure: LEFT HEART CATH AND  CORONARY ANGIOGRAPHY;  Surgeon: Leonie Man, MD;  Location: [facility];  Service: Cardiovascular;  Laterality: N/A;  . MITRAL VALVE REPLACEMENT         Family History  Problem Relation Age of Onset  . Hypertension Father   . Heart attack Father   . Breast cancer Maternal Grandmother   . Aneurysm Maternal Grandmother   . Clotting disorder Neg Hx   . Anesthesia problems Neg Hx     Social History   Tobacco Use  . Smoking status: Never Smoker  . Smokeless tobacco: Never Used  Vaping Use  . Vaping Use: Never used  Substance Use Topics  . Alcohol use: Yes  . Drug use: Never    Home Medications Prior to Admission medications   Medication Sig Start Date End Date Taking? Authorizing Provider  warfarin (COUMADIN) 6 MG tablet Take 2 tablets daily except 3 tablets on Tuesdays or as directed by Anticoagulation Clinic.  Must keep OV for future refills, overdue for follow-up. Patient taking differently: Take 12-18 mg by mouth See admin instructions. 3m daily except 150mon tuesdays 06/21/20  Yes NeDorothy SparkMD  atorvastatin (LIPITOR) 80 MG tablet Take 1 tablet (80 mg total) by mouth daily. Patient not taking: Reported on 07/17/2020 06/09/19   NeDorothy SparkMD  clopidogrel (PLAVIX) 75 MG tablet Take 1 tablet (75 mg total) by mouth daily. Patient not taking: Reported on 07/17/2020 06/09/19   NeDorothy SparkMD  metoprolol tartrate (LOPRESSOR) 25 MG tablet Take 1 tablet (25 mg total) by mouth 2 (two) times daily. Patient not taking: Reported on 07/17/2020 06/09/19   NeDorothy SparkMD  omega-3 acid ethyl esters (LOVAZA) 1 g capsule Take 1 capsule (1 g total) by mouth 2 (two) times daily. Patient not taking: Reported on 07/17/2020 06/09/19   Dorothy Spark, MD    Allergies    Patient has no known allergies.  Review of Systems   Review of Systems Ten systems are reviewed and are negative for acute change except as noted in the HPI  Physical  Exam Updated Vital Signs BP (!) 134/91   Pulse 72   Temp (!) 97 F (36.1 C)   Resp 17   Ht '5\' 9"'  (1.753 m)   Wt 99.8 kg   SpO2 98%   BMI 32.49 kg/m   Physical Exam Constitutional:      General: He is not in acute distress.    Appearance: Normal appearance. He is well-developed. He is not ill-appearing or diaphoretic.  HENT:     Head: Normocephalic and atraumatic.  Eyes:     General: Vision grossly intact. Gaze aligned appropriately.     Pupils: Pupils are equal, round, and reactive to light.  Neck:     Trachea: Trachea and phonation normal.  Pulmonary:     Effort: Pulmonary effort is normal. No respiratory distress.  Abdominal:     General: There is no distension.     Palpations: Abdomen is soft.     Tenderness: There is no abdominal tenderness. There is no guarding or rebound.  Musculoskeletal:        General: Normal range of motion.     Cervical back: Normal range of motion.  Skin:    General: Skin is warm and dry.  Neurological:     Mental Status: He is alert.     GCS: GCS eye subscore is 4. GCS verbal subscore is 5. GCS motor subscore is 6.     Comments: Speech is clear and goal oriented, follows commands Major Cranial nerves without deficit, no facial droop Normal strength in upper and lower extremities bilaterally including dorsiflexion and plantar flexion, strong and equal grip strength Sensation normal to light and sharp touch Moves extremities without ataxia, coordination intact Normal finger to nose and rapid alternating movements No pronator drift  Psychiatric:        Behavior: Behavior normal.     ED Results / Procedures / Treatments   Labs (all labs ordered are listed, but only abnormal results are displayed) Labs Reviewed  COMPREHENSIVE METABOLIC PANEL - Abnormal; Notable for the following components:      Result Value   AST 45 (*)    Total Bilirubin 1.5 (*)    All other components within normal limits  URINALYSIS, ROUTINE W REFLEX MICROSCOPIC  - Abnormal; Notable for the following components:   Specific Gravity, Urine >1.046 (*)    All other components within normal limits  RESPIRATORY PANEL BY RT PCR (FLU A&B, COVID)  ETHANOL  PROTIME-INR  APTT  CBC  DIFFERENTIAL  RAPID URINE DRUG SCREEN, HOSP PERFORMED  I-STAT CHEM 8, ED  CBG MONITORING, ED  TROPONIN I (HIGH SENSITIVITY)  TROPONIN I (HIGH SENSITIVITY)    EKG EKG Interpretation  Date/Time:  Sunday July 17 2020 10:05:23 EDT Ventricular Rate:  75 PR Interval:    QRS Duration: 116 QT Interval:  385 QTC Calculation: 430 R Axis:   131 Text Interpretation: Sinus rhythm Probable left atrial enlargement IRBBB and LPFB ST elevation, consider inferior injury Poor data quality no acute STEMI Confirmed by Madalyn Rob [phone]) on 07/17/2020 10:22:11 AM   Radiology CT  Code Stroke CTA Head W/WO contrast  Result Date: 07/17/2020 CLINICAL DATA:  Dizziness. History of mitral bowel prosthesis with noncompliant anticoagulation EXAM: CT ANGIOGRAPHY HEAD AND NECK TECHNIQUE: Multidetector CT imaging of the head and neck was performed using the standard protocol during bolus administration of intravenous contrast. Multiplanar CT image reconstructions and MIPs were obtained to evaluate the vascular anatomy. Carotid stenosis measurements (when applicable) are obtained utilizing NASCET criteria, using the distal internal carotid diameter as the denominator. CONTRAST:  76m OMNIPAQUE IOHEXOL 350 MG/ML SOLN COMPARISON:  Preceding head CT. FINDINGS: CTA NECK FINDINGS Aortic arch: 2 vessel branching.  No acute finding. Right carotid system: Vessels are smooth and widely patent. Left carotid system: Vessels are smooth and widely patent. Vertebral arteries: Strong left vertebral artery dominance. No subclavian stenosis. No dissection, beading, or stenosis. Skeleton: No acute or aggressive finding. Periodontal erosion associated with the lower left second molar. There are bilateral molar cavities.  Other neck: 19 mm left thyroid nodule which could be a cyst with thin septation Upper chest: No acute finding Review of the MIP images confirms the above findings CTA HEAD FINDINGS Anterior circulation: Vessels are smooth and widely patent. No aneurysm. Posterior circulation: Strong left vertebral artery dominance. The right vertebral artery service a tiny right PICA. The vertebral and basilar arteries are smooth and diffusely patent. No PCA branch occlusion or aneurysm. Venous sinuses: Patent as permitted by contrast timing Anatomic variants: None significant Review of the MIP images confirms the above findings IMPRESSION: 1. No emergent finding or arterial stenosis. 2. 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 Electronically Signed   By: JMonte FantasiaM.D.   On: 07/17/2020 10:49   CT Code Stroke CTA Neck W/WO contrast  Result Date: 07/17/2020 CLINICAL DATA:  Dizziness. History of mitral bowel prosthesis with noncompliant anticoagulation EXAM: CT ANGIOGRAPHY HEAD AND NECK TECHNIQUE: Multidetector CT imaging of the head and neck was performed using the standard protocol during bolus administration of intravenous contrast. Multiplanar CT image reconstructions and MIPs were obtained to evaluate the vascular anatomy. Carotid stenosis measurements (when applicable) are obtained utilizing NASCET criteria, using the distal internal carotid diameter as the denominator. CONTRAST:  668mOMNIPAQUE IOHEXOL 350 MG/ML SOLN COMPARISON:  Preceding head CT. FINDINGS: CTA NECK FINDINGS Aortic arch: 2 vessel branching.  No acute finding. Right carotid system: Vessels are smooth and widely patent. Left carotid system: Vessels are smooth and widely patent. Vertebral arteries: Strong left vertebral artery dominance. No subclavian stenosis. No dissection, beading, or stenosis. Skeleton: No acute or aggressive finding. Periodontal erosion associated with the lower left second molar. There are bilateral  molar cavities. Other neck: 19 mm left thyroid nodule which could be a cyst with thin septation Upper chest: No acute finding Review of the MIP images confirms the above findings CTA HEAD FINDINGS Anterior circulation: Vessels are smooth and widely patent. No aneurysm. Posterior circulation: Strong left vertebral artery dominance. The right vertebral artery service a tiny right PICA. The vertebral and basilar arteries are smooth and diffusely patent. No PCA branch occlusion or aneurysm. Venous sinuses: Patent as permitted by contrast timing Anatomic variants: None significant Review of the MIP images confirms the above findings IMPRESSION: 1. No emergent finding or arterial stenosis. 2. 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 Electronically Signed   By: JoMonte Fantasia.D.   On: 07/17/2020 10:49   MR BRAIN WO CONTRAST  Result Date: 07/17/2020 CLINICAL DATA:  Acute neuro deficit with  stroke suspected EXAM: MRI HEAD WITHOUT CONTRAST TECHNIQUE: Multiplanar, multiecho pulse sequences of the brain and surrounding structures were obtained without intravenous contrast. COMPARISON:  CTA head neck from earlier today.  01/29/2016 brain MRI FINDINGS: Brain: Cluster of remote superior cerebellar infarcts. Small remote left parietal cortex infarct. Although there is some intermediate diffusion signal about the infarct, the appearance is overall chronic by CT and MRI and some of this diffusion signal may be related to chronic blood products at this level. A few additional areas of remote microhemorrhage in the peripheral left cerebrum, possibly from prior embolic events. No hydrocephalus, collection, or masslike finding. Vascular: Preserved flow voids.  There was preceding CTA. Skull and upper cervical spine: Normal marrow signal. Sinuses/Orbits: Bilateral posterior ethmoid sinus opacification. IMPRESSION: 1. No acute infarct. 2. Small remote left superior cerebellar and left parietal cortex  infarcts. Electronically Signed   By: Monte Fantasia M.D.   On: 07/17/2020 11:26   DG Chest Portable 1 View  Result Date: 07/17/2020 CLINICAL DATA:  Dizziness with blurred vision EXAM: PORTABLE CHEST 1 VIEW COMPARISON:  Chest x-ray of 08/05/2018 FINDINGS: Trachea midline. Cardiomediastinal contours accentuated by portable technique, top normal size. Signs of mitral valve replacement as before. Hilar structures are normal. Lungs are clear. No sign of consolidation. No sign of pleural effusion. No acute skeletal process to the extent evaluated IMPRESSION: No acute cardiopulmonary disease. Electronically Signed   By: Zetta Bills M.D.   On: 07/17/2020 12:09   CT HEAD CODE STROKE WO CONTRAST  Result Date: 07/17/2020 CLINICAL DATA:  Code stroke.  Dizziness EXAM: CT HEAD WITHOUT CONTRAST TECHNIQUE: Contiguous axial images were obtained from the base of the skull through the vertex without intravenous contrast. COMPARISON:  01/29/2016 brain MRI FINDINGS: Brain: Bifrontal gray-white differentiation is poor relative to the remainder of the brain but attributed to artifact based on reformats. Small remote left parietal cortex infarct. No evidence of hemorrhage, hydrocephalus, or masslike finding Vascular: Prominent density of the basilar but not certainly hyperdense when compared to the carotid arteries. Skull: Negative Sinuses/Orbits: Negative Other: These results were communicated to Dr. Rory Percy at 10:22 amon 10/24/2021by text page via the [facility] messaging system. ASPECTS [facility] Stroke Program Early CT Score) Not scored with this history IMPRESSION: 1. No acute finding. 2. Small remote left parietal infarct that is new from a 2017 brain MRI. Electronically Signed   By: Monte Fantasia M.D.   On: 07/17/2020 10:39    Procedures .Critical Care Performed by: Deliah Boston, PA-C Authorized by: Deliah Boston, PA-C   Critical care provider statement:    Critical care time (minutes):  40   Critical  care was time spent personally by me on the following activities:  Discussions with consultants, evaluation of patient's response to treatment, examination of patient, ordering and performing treatments and interventions, ordering and review of laboratory studies, ordering and review of radiographic studies, pulse oximetry, re-evaluation of patient's condition, obtaining history from patient or surrogate, review of old charts and development of treatment plan with patient or surrogate   (including critical care time)  Medications Ordered in ED Medications  iohexol (OMNIPAQUE) 350 MG/ML injection 65 mL (65 mLs Intravenous Contrast Given 07/17/20 1030)    ED Course  I have reviewed the triage vital signs and the nursing notes.  Pertinent labs & imaging results that were available during my care of the patient were reviewed by me and considered in my medical decision making (see chart for details).    MDM  Rules/Calculators/A&P                         Additional history obtained from: 1. Nursing notes from this visit. 2. Review of electronic medical records. --------------- 9:55 AM: Initial evaluation, patient noncompliant with Coumadin therapy, high risk patient, history is concerning for possible CVA.  Onset of illness 7:30 AM.  Discussed case with Dr. Roslynn Amble, code stroke order set utilized.  Additionally due to patient's cardiac history added troponin/chest x-ray. ---------------------- Patient was taken to the CT scanner met by neurology.  Initial CT head and CT angio head neck were negative.  He was then taken to MRI, Dr. Rory Percy advises patient with left parietal embolic stroke, will need medicine admission for further work-up.  Patient's labs returned I interpreted the following, urinalysis without evidence of infection.  Initial troponin of 6 reassuring.  CBG within normal limits.  CMP without emergent electrolyte derangement, AKI, LFT elevations or gap.  CBC without ptosis or anemia.   Ethanol negative.  PT/INR subtherapeutic.  INR is 1.  UDS negative.  Patient was then reassessed he is resting comfortably in bed no acute distress he is agreeable for admission he has no complaints or concerns. - Consulted with internal medicine service and patient was accepted for admission.  Patient was seen and evaluated by Dr. Roslynn Amble during this visit.  Note: Portions of this report may have been transcribed using voice recognition software. Every effort was made to ensure accuracy; however, inadvertent computerized transcription errors may still be present. Final Clinical Impression(s) / ED Diagnoses Final diagnoses:  Cerebrovascular accident (CVA), unspecified mechanism ([place])  Subtherapeutic international normalized ratio (INR)    Rx / DC Orders ED Discharge Orders    None       Gari Crown 07/17/20 1238    Lucrezia Starch, MD 07/18/20 [id number]

## 2020-07-17 NOTE — Progress Notes (Signed)
ANTICOAGULATION CONSULT NOTE   Pharmacy Consult for Warfarin Indication: Mechanical Valve   No Known Allergies  Patient Measurements: Height: 5\' 9"  (175.3 cm) Weight: 99.8 kg (220 lb) IBW/kg (Calculated) : 70.7 Heparin Dosing Weight:   Vital Signs: Temp: 97 F (36.1 C) (10/24 1030) Temp Source: Oral (10/24 0944) BP: 139/66 (10/24 1345) Pulse Rate: 75 (10/24 1345)  Labs: Recent Labs    07/17/20 1004 07/17/20 1011 07/17/20 1143  HGB 15.0 15.6  --   HCT 44.2 46.0  --   PLT 187  --   --   APTT 32  --   --   LABPROT 13.4  --   --   INR 1.1  --   --   CREATININE 1.21 1.10  --   TROPONINIHS 6  --  7    Estimated Creatinine Clearance: 109.1 mL/min (by C-G formula based on SCr of 1.1 mg/dL).   Medical History: Past Medical History:  Diagnosis Date   CAD (coronary artery disease)    a. 08/06/18- LHC- mechanical valve thromboembolic subtotal occlusion of distal-mid LAD with successful balloon angioplasty    History of prosthetic mitral valve 2016   Hypertension     Medications:  Scheduled:   atorvastatin  80 mg Oral Daily   warfarin  10 mg Oral ONCE-1600   Warfarin - Pharmacist Dosing Inpatient   Does not apply q1600    Assessment: Patient is a 34 yom that presented to the ED after experiencing some dizziness. The patient was activated as a stroke and was found to have and ischemic stroke on MRI. With NIH of 0 risk of tPA was higher than benefit. Patient has a [facility] jude valve and is on warfarin for this however states he has not taken in last couple weeks.   In sept therapeutic on: 18 mg Tues 12mg  ROW  Goal of Therapy:  INR 2.5-3.5 Monitor platelets by anticoagulation protocol: Yes   Plan:  - In light of recent stroke will be slightly less aggressive with reaching goal  - Warfarin 10mg  PO x 1 dose tonight  - Will also start Lovenox 100mg  SubQ q12h until INR > 2.5 - Monitor patient for s/s of bleeding, PT-INR, and CBC   2017 PharmD. BCPS   07/17/2020,2:34 PM

## 2020-07-17 NOTE — ED Notes (Signed)
ACTIVATED CODE STROKE 

## 2020-07-17 NOTE — Evaluation (Signed)
Clinical/Bedside Swallow Evaluation Patient Details  Name: Benjamin Rodriguez MRN: [id number] Date of Birth: [birth year]  Today's Date: 07/17/2020 Time: SLP Start Time (ACUTE ONLY): 1625 SLP Stop Time (ACUTE ONLY): 1635 SLP Time Calculation (min) (ACUTE ONLY): 10 min  Past Medical History:  Past Medical History:  Diagnosis Date  . CAD (coronary artery disease)    a. 08/06/18- LHC- mechanical valve thromboembolic subtotal occlusion of distal-mid LAD with successful balloon angioplasty   . History of prosthetic mitral valve 2016  . Hypertension    Past Surgical History:  Past Surgical History:  Procedure Laterality Date  . CARDIAC SURGERY    . CORONARY BALLOON ANGIOPLASTY N/A 08/06/2018   Procedure: CORONARY BALLOON ANGIOPLASTY;  Surgeon: Marykay Lex, MD;  Location: [facility] INVASIVE CV LAB;  Service: Cardiovascular;  Laterality: N/A;  . LEFT HEART CATH AND CORONARY ANGIOGRAPHY N/A 08/06/2018   Procedure: LEFT HEART CATH AND CORONARY ANGIOGRAPHY;  Surgeon: Marykay Lex, MD;  Location: [facility] INVASIVE CV LAB;  Service: Cardiovascular;  Laterality: N/A;  . MITRAL VALVE REPLACEMENT     HPI:  Patient is a 34 y.o. male with PMH: NSTEMI of distal to mid LAD with left heart cath baloon angioplasty in 2019, mitral stenosis, s/p mitral valve replacement at [facility] in 2016 on coumadin, who presented to the ED after having headache, dizziness and loss of balance; MRI revealed left parietal embolic stroke.   Assessment / Plan / Recommendation Clinical Impression  Patient presents with an oropharyngeal swallow that is WNL and without overt s/s of aspiration, penetration or difficulty. Patient denies any current or past swallowing difficulties. No further swallow function testing indicated at this time. SLP Visit Diagnosis: Dysphagia, unspecified (R13.10)    Aspiration Risk  No limitations    Diet Recommendation Regular;Thin liquid   Liquid Administration via: Cup;Straw Medication Administration:  Whole meds with liquid Supervision: Patient able to self feed    Other  Recommendations Oral Care Recommendations: Oral care BID;Patient independent with oral care   Follow up Recommendations None      Frequency and Duration   N/A         Prognosis   N/A    Swallow Study   General Date of Onset: 07/17/20 HPI: Patient is a 34 y.o. male with PMH: NSTEMI of distal to mid LAD with left heart cath baloon angioplasty in 2019, mitral stenosis, s/p mitral valve replacement at [facility] in 2016 on coumadin, who presented to the ED after having headache, dizziness and loss of balance; MRI revealed left parietal embolic stroke. Type of Study: Bedside Swallow Evaluation Previous Swallow Assessment: N/A Diet Prior to this Study: NPO Temperature Spikes Noted: No Respiratory Status: Room air History of Recent Intubation: No Behavior/Cognition: Alert;Cooperative;Pleasant mood Oral Cavity Assessment: Within Functional Limits Oral Care Completed by SLP: No Oral Cavity - Dentition: Adequate natural dentition Vision: Functional for self-feeding Self-Feeding Abilities: Able to feed self Patient Positioning: Upright in bed Baseline Vocal Quality: Normal Volitional Cough: Strong Volitional Swallow: Able to elicit    Oral/Motor/Sensory Function Overall Oral Motor/Sensory Function: Within functional limits   Ice Chips     Thin Liquid Thin Liquid: Within functional limits Presentation: Straw;Self Fed    Nectar Thick     Honey Thick     Puree     Solid     Solid: Within functional limits      Angela Nevin, MA, CCC-SLP Speech Therapy [facility]

## 2020-07-18 ENCOUNTER — Other Ambulatory Visit: Payer: Self-pay

## 2020-07-18 ENCOUNTER — Other Ambulatory Visit (HOSPITAL_COMMUNITY): Payer: Self-pay | Admitting: Student

## 2020-07-18 ENCOUNTER — Encounter (HOSPITAL_COMMUNITY): Payer: Self-pay | Admitting: Internal Medicine

## 2020-07-18 DIAGNOSIS — D688 Other specified coagulation defects: Secondary | ICD-10-CM | POA: Diagnosis not present

## 2020-07-18 DIAGNOSIS — I6310 Cerebral infarction due to embolism of unspecified precerebral artery: Secondary | ICD-10-CM | POA: Diagnosis not present

## 2020-07-18 DIAGNOSIS — Z79899 Other long term (current) drug therapy: Secondary | ICD-10-CM | POA: Diagnosis not present

## 2020-07-18 DIAGNOSIS — I25110 Atherosclerotic heart disease of native coronary artery with unstable angina pectoris: Secondary | ICD-10-CM | POA: Diagnosis not present

## 2020-07-18 DIAGNOSIS — I6340 Cerebral infarction due to embolism of unspecified cerebral artery: Secondary | ICD-10-CM | POA: Insufficient documentation

## 2020-07-18 DIAGNOSIS — Z7901 Long term (current) use of anticoagulants: Secondary | ICD-10-CM | POA: Diagnosis not present

## 2020-07-18 DIAGNOSIS — G459 Transient cerebral ischemic attack, unspecified: Secondary | ICD-10-CM | POA: Insufficient documentation

## 2020-07-18 DIAGNOSIS — I119 Hypertensive heart disease without heart failure: Secondary | ICD-10-CM | POA: Diagnosis not present

## 2020-07-18 DIAGNOSIS — Z20822 Contact with and (suspected) exposure to covid-19: Secondary | ICD-10-CM | POA: Diagnosis not present

## 2020-07-18 LAB — CBC
HCT: 45.6 % (ref 39.0–52.0)
Hemoglobin: 15.5 g/dL (ref 13.0–17.0)
MCH: 29.9 pg (ref 26.0–34.0)
MCHC: 34 g/dL (ref 30.0–36.0)
MCV: 87.9 fL (ref 80.0–100.0)
Platelets: 214 10*3/uL (ref 150–400)
RBC: 5.19 MIL/uL (ref 4.22–5.81)
RDW: 13.1 % (ref 11.5–15.5)
WBC: 3.4 10*3/uL — ABNORMAL LOW (ref 4.0–10.5)
nRBC: 0 % (ref 0.0–0.2)

## 2020-07-18 LAB — HEMOGLOBIN A1C
Hgb A1c MFr Bld: 5 % (ref 4.8–5.6)
Mean Plasma Glucose: 97 mg/dL

## 2020-07-18 LAB — PROTIME-INR
INR: 1.2 (ref 0.8–1.2)
Prothrombin Time: 14.4 seconds (ref 11.4–15.2)

## 2020-07-18 MED ORDER — ENOXAPARIN SODIUM 100 MG/ML ~~LOC~~ SOLN: 100.0000 mg | Freq: Two times a day (BID) | SUBCUTANEOUS | 0 refills | Status: DC

## 2020-07-18 MED ORDER — WARFARIN SODIUM 6 MG PO TABS
12.0000 mg | ORAL_TABLET | Freq: Once | ORAL | Status: DC
  Filled 2020-07-18: qty 2

## 2020-07-18 MED FILL — ENOXAPARIN 100 MG/ML SYR: 100 | 10 days supply | Qty: 20 | Fill #0

## 2020-07-18 NOTE — Progress Notes (Signed)
RN gave pt his discharge paperwork and pt stated understanding. IV has been removed 1 new medication escribed to home pharmacy for pick up. Pt taught how to give himself Lovenox shots.

## 2020-07-18 NOTE — Evaluation (Addendum)
Physical Therapy Evaluation and Discharge Patient Details Name: Benjamin Rodriguez MRN: [id number] DOB: 1986/04/17 Today's Date: 07/18/2020   History of Present Illness  [name] is a 34 y/o M with a PMHx NSTEMI of distal to mid LAD w/left heart cath balloon angioplasty (07/2018), mitral stenosis (non-rheumatic) s/p mitral valve replacement at [facility] 2016 on coumadin presented to ED after having a headache, dizziness, and loss of balance this am. 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    Clinical Impression  Pt admitted with above. Pt denies dizziness and is functioning at baseline. Pt scored 24/24 on DGI and is functioning at indep level without AD. Pt able to maintain balance against maximal perturbations in all directions. Pt educated on BE FAST. Pt with no further Acute PT services. PT SIGNING OFF.    Follow Up Recommendations No PT follow up    Equipment Recommendations  None recommended by PT    Recommendations for Other Services       Precautions / Restrictions Precautions Precautions: None Restrictions Weight Bearing Restrictions: No      Mobility  Bed Mobility Overal bed mobility: Independent                  Transfers Overall transfer level: Independent               General transfer comment: no difficulty, denies dizziness  Ambulation/Gait Ambulation/Gait assistance: Independent Gait Distance (Feet): 500 Feet Assistive device: None Gait Pattern/deviations: WFL(Within Functional Limits) Gait velocity: wfl Gait velocity interpretation: >4.37 ft/sec, indicative of normal walking speed General Gait Details: no difficulty, denies dizziness even with head turns  Stairs Stairs: Yes Stairs assistance: Independent Stair Management: No rails;Alternating pattern;Forwards Number of Stairs: 12 General stair comments: no difficulty, denies dizziness  Wheelchair Mobility    Modified Rankin  (Stroke Patients Only) Modified Rankin (Stroke Patients Only) Pre-Morbid Rankin Score: No symptoms Modified Rankin: No symptoms     Balance Overall balance assessment: Independent                               Standardized Balance Assessment Standardized Balance Assessment : Dynamic Gait Index   Dynamic Gait Index Level Surface: Normal Change in Gait Speed: Normal Gait with Horizontal Head Turns: Normal Gait with Vertical Head Turns: Normal Gait and Pivot Turn: Normal Step Over Obstacle: Normal Step Around Obstacles: Normal Steps: Normal Total Score: 24       Pertinent Vitals/Pain Pain Assessment: No/denies pain    Home Living Family/patient expects to be discharged to:: Private residence Living Arrangements: Alone Available Help at Discharge: Family;Friend(s);Available PRN/intermittently Type of Home: Apartment Home Access: Stairs to enter;Elevator Entrance Stairs-Rails: Can reach both Entrance Stairs-Number of Steps: lives on 4th floor, 4 flights but has elevator Home Layout: One level Home Equipment: None      Prior Function Level of Independence: Independent         Comments: works with [employer] with [employer]     Hand Dominance   Dominant Hand: Right    Extremity/Trunk Assessment   Upper Extremity Assessment Upper Extremity Assessment: Overall WFL for tasks assessed    Lower Extremity Assessment Lower Extremity Assessment: Overall WFL for tasks assessed    Cervical / Trunk Assessment Cervical / Trunk Assessment: Normal  Communication   Communication: No difficulties  Cognition Arousal/Alertness: Awake/alert Behavior During Therapy: WFL for tasks assessed/performed Overall Cognitive Status: Within Functional Limits  for tasks assessed                                        General Comments General comments (skin integrity, edema, etc.): VSS    Exercises     Assessment/Plan    PT Assessment Patent does not need any  further PT services  PT Problem List         PT Treatment Interventions      PT Goals (Current goals can be found in the Care Plan section)  Acute Rehab PT Goals Patient Stated Goal: home PT Goal Formulation: All assessment and education complete, DC therapy    Frequency     Barriers to discharge        Co-evaluation               AM-PAC PT "6 Clicks" Mobility  Outcome Measure Help needed turning from your back to your side while in a flat bed without using bedrails?: None Help needed moving from lying on your back to sitting on the side of a flat bed without using bedrails?: None Help needed moving to and from a bed to a chair (including a wheelchair)?: None Help needed standing up from a chair using your arms (e.g., wheelchair or bedside chair)?: None Help needed to walk in hospital room?: None Help needed climbing 3-5 steps with a railing? : None 6 Click Score: 24    End of Session   Activity Tolerance: Patient tolerated treatment well Patient left: in bed;with call bell/phone within reach Nurse Communication: Mobility status PT Visit Diagnosis: Dizziness and giddiness (R42)    Time: 7510-2585 PT Time Calculation (min) (ACUTE ONLY): 12 min   Charges:   PT Evaluation $PT Eval Low Complexity: 1 Low          Lewis Shock, PT, DPT Acute Rehabilitation Services Pager #: [phone] Office #: [phone]   Iona Hansen 07/18/2020, 8:28 AM

## 2020-07-18 NOTE — Evaluation (Signed)
Occupational Therapy Evaluation Patient Details Name: Benjamin Rodriguez MRN: [id number] DOB: [birth year] Today's Date: 07/18/2020    History of Present Illness Benjamin Rodriguez is a 34 y/o M with a PMHx NSTEMI of distal to mid LAD w/left heart cath balloon angioplasty (07/2018), mitral stenosis (non-rheumatic) s/p mitral valve replacement at [facility] 2016 on coumadin presented to ED after having a headache, dizziness, and loss of balance this am. 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   Clinical Impression   Pt presents in bed, agreeable to OT evaluation. Pt reports being back at baseline level. Demonstrated ability to complete ADL/IADL independently without LOB and no dizziness. Pt demonstrated normal vision and normal cognition. Patient evaluated by Occupational Therapy with no further acute OT needs identified. All education has been completed and the patient has no further questions. See below for any follow-up Occupational Therapy or equipment needs. OT to sign off. Thank you for referral.      Follow Up Recommendations  No OT follow up    Equipment Recommendations  None recommended by OT    Recommendations for Other Services       Precautions / Restrictions Precautions Precautions: None Restrictions Weight Bearing Restrictions: No      Mobility Bed Mobility Overal bed mobility: Independent                  Transfers Overall transfer level: Independent               General transfer comment: no difficulty, denies dizziness    Balance Overall balance assessment: Independent                               Standardized Balance Assessment Standardized Balance Assessment : Dynamic Gait Index   Dynamic Gait Index Level Surface: Normal Change in Gait Speed: Normal Gait with Horizontal Head Turns: Normal Gait with Vertical Head Turns: Normal Gait and Pivot Turn: Normal Step Over Obstacle:  Normal Step Around Obstacles: Normal Steps: Normal Total Score: 24     ADL either performed or assessed with clinical judgement   ADL Overall ADL's : Independent                                       General ADL Comments: pt is functioning at baseline     Vision Baseline Vision/History: No visual deficits Patient Visual Report: No change from baseline Vision Assessment?: No apparent visual deficits Additional Comments: assessed vision, no limitations/deficits noted. All areas of vision WNL     Perception     Praxis      Pertinent Vitals/Pain Pain Assessment: No/denies pain     Hand Dominance Right   Extremity/Trunk Assessment Upper Extremity Assessment Upper Extremity Assessment: Overall WFL for tasks assessed   Lower Extremity Assessment Lower Extremity Assessment: Overall WFL for tasks assessed   Cervical / Trunk Assessment Cervical / Trunk Assessment: Normal   Communication Communication Communication: No difficulties   Cognition Arousal/Alertness: Awake/Rodriguez Behavior During Therapy: WFL for tasks assessed/performed Overall Cognitive Status: Within Functional Limits for tasks assessed                                 General Comments: Pt scored 0/10 on Short Blessed Test screening indicating cognition  is within normal functioning   General Comments  VSS     Exercises Exercises: Other exercises Other Exercises Other Exercises: educated pt on BE FAST and lifestyle habits to decrease risk of stroke with provided handouts. Pt verbalized understanding   Shoulder Instructions      Home Living Family/patient expects to be discharged to:: Private residence Living Arrangements: Alone Available Help at Discharge: Family;Friend(s);Available PRN/intermittently Type of Home: Apartment Home Access: Stairs to enter;Elevator Entrance Stairs-Number of Steps: lives on 4th floor, 4 flights but has Futures trader: Can  reach both Home Layout: One level     Bathroom Shower/Tub: Producer, television/film/video: Standard     Home Equipment: None          Prior Functioning/Environment Level of Independence: Independent        Comments: works with [facility] [employer] [facility] with [employer]        OT Problem List: Decreased knowledge of precautions;Impaired balance (sitting and/or standing)      OT Treatment/Interventions:      OT Goals(Current goals can be found in the care plan section) Acute Rehab OT Goals Patient Stated Goal: home OT Goal Formulation: With patient Time For Goal Achievement: 07/25/20 Potential to Achieve Goals: Good  OT Frequency:     Barriers to D/C:            Co-evaluation              AM-PAC OT "6 Clicks" Daily Activity     Outcome Measure Help from another person eating meals?: None Help from another person taking care of personal grooming?: None Help from another person toileting, which includes using toliet, bedpan, or urinal?: None Help from another person bathing (including washing, rinsing, drying)?: None Help from another person to put on and taking off regular upper body clothing?: None Help from another person to put on and taking off regular lower body clothing?: None 6 Click Score: 24   End of Session Nurse Communication: Mobility status  Activity Tolerance: Patient tolerated treatment well Patient left: in bed;with call bell/phone within reach  OT Visit Diagnosis: Other abnormalities of gait and mobility (R26.89)                Time: 7591-6384 OT Time Calculation (min): 12 min Charges:  OT General Charges $OT Visit: 1 Visit OT Evaluation $OT Eval Low Complexity: 1 Low  Benjamin Rodriguez OTR/L Acute Rehabilitation Services Office: [phone]   Benjamin Rodriguez 07/18/2020, 10:05 AM

## 2020-07-18 NOTE — Progress Notes (Addendum)
HD#1 Subjective:  Overnight Events: None  Patient states he is doing well, feels back to his baseline. Denies dizziness or imbalance. Reports he was up walking around with physical therapy who told him he doesn't require PT services.  Reports he was not taking his statin. Only really was taking his warfarin until he ran out 2 weeks ago. He states he missed his appointment at [facility] coumadin clinic and has not scheduled a follow up appointment since. Does not have a primary care doctor. Interested in following up with [facility].  Recalls his therapeutic coumadin range is 2.5-3.5.  Advised patient regarding the importance of adherence to warfarin. When asked if he takes a statin, patient denied.   Objective:  Vital signs in last 24 hours: Vitals:   07/17/20 1531 07/17/20 1843 07/18/20 0002 07/18/20 0437  BP: 138/87 132/81 135/70 (!) 127/95  Pulse: 70 75 64 65  Resp: 16 16 17 16   Temp: 98.3 F (36.8 C) 98.3 F (36.8 C) 98.2 F (36.8 C) 97.6 F (36.4 C)  TempSrc: Oral Oral Oral Oral  SpO2: 98% 94% 98% 95%  Weight:      Height:       Supplemental O2: Room Air SpO2: 95 %   Physical Exam:  Physical Exam Vitals and nursing note reviewed.  Constitutional:      General: He is not in acute distress.    Appearance: Normal appearance. He is not ill-appearing or toxic-appearing.  Cardiovascular:     Rate and Rhythm: Normal rate and regular rhythm.     Heart sounds: No murmur heard.  No friction rub. No gallop.      Comments: Mid systolic click Pulmonary:     Effort: Pulmonary effort is normal. No respiratory distress.  Skin:    General: Skin is warm and dry.  Neurological:     General: No focal deficit present.     Mental Status: He is alert and oriented to person, place, and time. Mental status is at baseline.  Psychiatric:        Mood and Affect: Mood normal.        Behavior: Behavior normal.     Filed Weights   07/17/20 0944  Weight: 99.8 kg    No intake or output data  in the 24 hours ending 07/18/20 0535 Net IO Since Admission: No IO data has been entered for this period [07/18/20 0535]  Pertinent Labs: CBC Latest Ref Rng & Units 07/17/2020 07/17/2020 11/25/2019  WBC 4.0 - 10.5 K/uL - 4.0 4.7  Hemoglobin 13.0 - 17.0 g/dL 01/25/2020 67.6 19.5  Hematocrit 39 - 52 % 46.0 44.2 42.8  Platelets 150 - 400 K/uL - 187 250    CMP Latest Ref Rng & Units 07/17/2020 07/17/2020 11/25/2019  Glucose 70 - 99 mg/dL 85 87 86  BUN 6 - 20 mg/dL 14 14 7   Creatinine 0.61 - 1.24 mg/dL 01/25/2020 2.67  Sodium 135 - 145 mmol/L 140 137 140  Potassium 3.5 - 5.1 mmol/L 4.2 4.8 4.2  Chloride 98 - 111 mmol/L 102 102 103  CO2 22 - 32 mmol/L - 25 24  Calcium 8.9 - 10.3 mg/dL - 9.3 9.0  Total Protein 6.5 - 8.1 g/dL - 7.3 7.3  Total Bilirubin 0.3 - 1.2 mg/dL - 1.5(H) 0.7  Alkaline Phos 38 - 126 U/L - 49 64  AST 15 - 41 U/L - 45(H) 29  ALT 0 - 44 U/L - 38 30    Imaging: CT Code Stroke  CTA Head W/WO contrast  Result Date: 07/17/2020 CLINICAL DATA:  Dizziness. History of mitral bowel prosthesis with noncompliant anticoagulation EXAM: CT ANGIOGRAPHY HEAD AND NECK TECHNIQUE: Multidetector CT imaging of the head and neck was performed using the standard protocol during bolus administration of intravenous contrast. Multiplanar CT image reconstructions and MIPs were obtained to evaluate the vascular anatomy. Carotid stenosis measurements (when applicable) are obtained utilizing NASCET criteria, using the distal internal carotid diameter as the denominator. CONTRAST:  65mL OMNIPAQUE IOHEXOL 350 MG/ML SOLN COMPARISON:  Preceding head CT. FINDINGS: CTA NECK FINDINGS Aortic arch: 2 vessel branching.  No acute finding. Right carotid system: Vessels are smooth and widely patent. Left carotid system: Vessels are smooth and widely patent. Vertebral arteries: Strong left vertebral artery dominance. No subclavian stenosis. No dissection, beading, or stenosis. Skeleton: No acute or aggressive finding. Periodontal  erosion associated with the lower left second molar. There are bilateral molar cavities. Other neck: 19 mm left thyroid nodule which could be a cyst with thin septation Upper chest: No acute finding Review of the MIP images confirms the above findings CTA HEAD FINDINGS Anterior circulation: Vessels are smooth and widely patent. No aneurysm. Posterior circulation: Strong left vertebral artery dominance. The right vertebral artery service a tiny right PICA. The vertebral and basilar arteries are smooth and diffusely patent. No PCA branch occlusion or aneurysm. Venous sinuses: Patent as permitted by contrast timing Anatomic variants: None significant Review of the MIP images confirms the above findings IMPRESSION: 1. No emergent finding or arterial stenosis. 2. 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 Electronically Signed   By: Marnee Spring M.D.   On: 07/17/2020 10:49   CT Code Stroke CTA Neck W/WO contrast  Result Date: 07/17/2020 CLINICAL DATA:  Dizziness. History of mitral bowel prosthesis with noncompliant anticoagulation EXAM: CT ANGIOGRAPHY HEAD AND NECK TECHNIQUE: Multidetector CT imaging of the head and neck was performed using the standard protocol during bolus administration of intravenous contrast. Multiplanar CT image reconstructions and MIPs were obtained to evaluate the vascular anatomy. Carotid stenosis measurements (when applicable) are obtained utilizing NASCET criteria, using the distal internal carotid diameter as the denominator. CONTRAST:  65mL OMNIPAQUE IOHEXOL 350 MG/ML SOLN COMPARISON:  Preceding head CT. FINDINGS: CTA NECK FINDINGS Aortic arch: 2 vessel branching.  No acute finding. Right carotid system: Vessels are smooth and widely patent. Left carotid system: Vessels are smooth and widely patent. Vertebral arteries: Strong left vertebral artery dominance. No subclavian stenosis. No dissection, beading, or stenosis. Skeleton: No acute or aggressive  finding. Periodontal erosion associated with the lower left second molar. There are bilateral molar cavities. Other neck: 19 mm left thyroid nodule which could be a cyst with thin septation Upper chest: No acute finding Review of the MIP images confirms the above findings CTA HEAD FINDINGS Anterior circulation: Vessels are smooth and widely patent. No aneurysm. Posterior circulation: Strong left vertebral artery dominance. The right vertebral artery service a tiny right PICA. The vertebral and basilar arteries are smooth and diffusely patent. No PCA branch occlusion or aneurysm. Venous sinuses: Patent as permitted by contrast timing Anatomic variants: None significant Review of the MIP images confirms the above findings IMPRESSION: 1. No emergent finding or arterial stenosis. 2. 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 Electronically Signed   By: Marnee Spring M.D.   On: 07/17/2020 10:49   MR BRAIN WO CONTRAST  Result Date: 07/17/2020 CLINICAL DATA:  Acute neuro deficit with stroke suspected  EXAM: MRI HEAD WITHOUT CONTRAST TECHNIQUE: Multiplanar, multiecho pulse sequences of the brain and surrounding structures were obtained without intravenous contrast. COMPARISON:  CTA head neck from earlier today.  01/29/2016 brain MRI FINDINGS: Brain: Cluster of remote superior cerebellar infarcts. Small remote left parietal cortex infarct. Although there is some intermediate diffusion signal about the infarct, the appearance is overall chronic by CT and MRI and some of this diffusion signal may be related to chronic blood products at this level. A few additional areas of remote microhemorrhage in the peripheral left cerebrum, possibly from prior embolic events. No hydrocephalus, collection, or masslike finding. Vascular: Preserved flow voids.  There was preceding CTA. Skull and upper cervical spine: Normal marrow signal. Sinuses/Orbits: Bilateral posterior ethmoid sinus opacification.  IMPRESSION: 1. No acute infarct. 2. 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 Electronically Signed   By: Marnee Spring M.D.   On: 07/17/2020 11:26   DG Chest Portable 1 View  Result Date: 07/17/2020 CLINICAL DATA:  Dizziness with blurred vision EXAM: PORTABLE CHEST 1 VIEW COMPARISON:  Chest x-ray of 08/05/2018 FINDINGS: Trachea midline. Cardiomediastinal contours accentuated by portable technique, top normal size. Signs of mitral valve replacement as before. Hilar structures are normal. Lungs are clear. No sign of consolidation. No sign of pleural effusion. No acute skeletal process to the extent evaluated IMPRESSION: No acute cardiopulmonary disease. Electronically Signed   By: Donzetta Kohut M.D.   On: 07/17/2020 12:09   ECHOCARDIOGRAM COMPLETE  Result Date: 07/17/2020    ECHOCARDIOGRAM REPORT   Patient Name:   VIHAAN GLOSS Date of Exam: 07/17/2020 Medical Rec #:  [id number]      Height:       69.0 in Accession #:    [id number]     Weight:       220.0 lb Date of Birth:  1985-09-28     BSA:          2.151 m Patient Age:    34 years       BP:           139/66 mmHg Patient Gender: M              HR:           71 bpm. Exam Location:  Inpatient Procedure: 2D Echo Indications:    TIA  History:        Patient has prior history of Echocardiogram examinations, most                 recent 06/17/2019. CAD.                  Mitral Valve: St. Jude mechanical valve valve is present in the                 mitral position.  Sonographer:    Delcie Roch Referring Phys: [id number] RICHARD S DYKSTRA IMPRESSIONS  1. Septal hypokinesis. Left ventricular ejection fraction, by estimation, is 45 to 50%. The left ventricle has mildly decreased function. The left ventricle demonstrates global hypokinesis. Left ventricular diastolic parameters are indeterminate.  2. Right ventricular systolic function is normal. The right ventricular size is normal. There is normal pulmonary artery systolic  pressure.  3. Mechanical mitral valve. Mean gradient 5 mmHg, essentially unchanged from 05/2019. Valve leaflets appear to be moving freely. . The mitral valve has been repaired/replaced. No evidence of mitral valve regurgitation. No evidence of mitral stenosis. The  mean mitral valve gradient is 5.0 mmHg. There  is a St. Jude mechanical valve present in the mitral position.  4. The aortic valve is tricuspid. Aortic valve regurgitation is not visualized. No aortic stenosis is present.  5. The inferior vena cava is normal in size with greater than 50% respiratory variability, suggesting right atrial pressure of 3 mmHg. FINDINGS  Left Ventricle: Septal hypokinesis. Left ventricular ejection fraction, by estimation, is 45 to 50%. The left ventricle has mildly decreased function. The left ventricle demonstrates global hypokinesis. The left ventricular internal cavity size was normal in size. There is no left ventricular hypertrophy. Left ventricular diastolic parameters are indeterminate. Right Ventricle: The right ventricular size is normal. No increase in right ventricular wall thickness. Right ventricular systolic function is normal. There is normal pulmonary artery systolic pressure. The tricuspid regurgitant velocity is 2.47 m/s, and  with an assumed right atrial pressure of 3 mmHg, the estimated right ventricular systolic pressure is 27.4 mmHg. Left Atrium: Left atrial size was normal in size. Right Atrium: Right atrial size was normal in size. Pericardium: There is no evidence of pericardial effusion. Mitral Valve: Mechanical mitral valve. Mean gradient 5 mmHg, essentially unchanged from 05/2019. Valve leaflets appear to be moving freely. The mitral valve has been repaired/replaced. No evidence of mitral valve regurgitation. There is a St. Jude mechanical valve present in the mitral position. No evidence of mitral valve stenosis. MV peak gradient, 10.1 mmHg. The mean mitral valve gradient is 5.0 mmHg. Tricuspid  Valve: The tricuspid valve is normal in structure. Tricuspid valve regurgitation is trivial. No evidence of tricuspid stenosis. Aortic Valve: The aortic valve is tricuspid. Aortic valve regurgitation is not visualized. No aortic stenosis is present. Pulmonic Valve: The pulmonic valve was normal in structure. Pulmonic valve regurgitation is not visualized. No evidence of pulmonic stenosis. Aorta: The aortic root is normal in size and structure. Venous: The inferior vena cava is normal in size with greater than 50% respiratory variability, suggesting right atrial pressure of 3 mmHg. IAS/Shunts: No atrial level shunt detected by color flow Doppler.  LEFT VENTRICLE PLAX 2D LVIDd:         5.20 cm LVIDs:         3.90 cm LV PW:         1.10 cm LV IVS:        0.90 cm LVOT diam:     2.20 cm LV SV:         61 LV SV Index:   28 LVOT Area:     3.80 cm  LV Volumes (MOD) LV vol d, MOD A4C: 99.0 ml LV vol s, MOD A4C: 55.7 ml LV SV MOD A4C:     99.0 ml RIGHT VENTRICLE            IVC RV S prime:     6.96 cm/s  IVC diam: 1.50 cm TAPSE (M-mode): 1.1 cm LEFT ATRIUM           Index       RIGHT ATRIUM           Index LA Vol (A4C): 48.4 ml 22.50 ml/m RA Area:     10.50 cm                                   RA Volume:   20.10 ml  9.34 ml/m  AORTIC VALVE LVOT Vmax:   84.90 cm/s LVOT Vmean:  56.000 cm/s LVOT VTI:    0.161  m  AORTA Ao Root diam: 3.50 cm Ao Asc diam:  2.80 cm MITRAL VALVE                TRICUSPID VALVE MV Area (PHT): 2.91 cm     TR Peak grad:   24.4 mmHg MV Peak grad:  10.1 mmHg    TR Vmax:        247.00 cm/s MV Mean grad:  5.0 mmHg MV Vmax:       1.59 m/s     SHUNTS MV Vmean:      108.0 cm/s   Systemic VTI:  0.16 m MV Decel Time: 261 msec     Systemic Diam: 2.20 cm MV E velocity: 133.00 cm/s MV A velocity: 103.00 cm/s MV E/A ratio:  1.29 Chilton Siiffany [place] MD Electronically signed by Chilton Siiffany [place] MD Signature Date/Time: 07/17/2020/5:08:04 PM    Final    CT HEAD CODE STROKE WO CONTRAST  Result Date:  07/17/2020 CLINICAL DATA:  Code stroke.  Dizziness EXAM: CT HEAD WITHOUT CONTRAST TECHNIQUE: Contiguous axial images were obtained from the base of the skull through the vertex without intravenous contrast. COMPARISON:  01/29/2016 brain MRI FINDINGS: Brain: Bifrontal gray-white differentiation is poor relative to the remainder of the brain but attributed to artifact based on reformats. Small remote left parietal cortex infarct. No evidence of hemorrhage, hydrocephalus, or masslike finding Vascular: Prominent density of the basilar but not certainly hyperdense when compared to the carotid arteries. Skull: Negative Sinuses/Orbits: Negative Other: These results were communicated to Dr. Wilford CornerArora at 10:22 amon 10/24/2021by text page via the [facility] messaging system. ASPECTS [facility]([place] Stroke Program Early CT Score) Not scored with this history IMPRESSION: 1. No acute finding. 2. Small remote left parietal infarct that is new from a 2017 brain MRI. Electronically Signed   By: Marnee SpringJonathon  Watts M.D.   On: 07/17/2020 10:39    Assessment/Plan:   Active Problems:   Dizziness  Patient Summary: Evalina FieldShedrick Schrier is a 34 y.o. 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 CTA neck negative for carotid stenosis. Echocardiogram negative for vegetations on prosthetic mitral valve. Patient is currently at baseline and denies any residual neurological deficits. PT/OT/speech patient functioning at baseline, signing off.  Emphasized importance of patient continuing his coumadin treatment and following closely with [facility] coumadin clinic.  -pending neurology recommendations, appreciate their recs -baseline neurological status, will discharge once cleared by neurology -INR 1.2 (patient endorses therapeutic INR at 2.5-3.5. ) -continue lovenox, transition to coumadin once therapeutic levels -frequent neuro checks -continue to allow permissive HTN -A1c pending  Hypercholeterolemias Hyperlipidemia Total cholesterol of 175 .LDL of 240. Patient denies history of statin use, atorvastatin 80 mg and lovaza 1 g total BID on home medication list.  -atorvastatin 80 mg during admission and continue at discharge  Dental Disease Patient found to have dental disease on CTA neck, notable in setting of prosthetic mitral valve disease.  - will give list of dentists at discharge  Modified Rankin Score For Neurological Disability: 0  Diet: Heart Healthy IVF: None,None VTE: Enoxaparin Code: Full PT/OT recs: Pending  Dispo: Anticipated discharge to Home in 0-1 days pending stroke work-up.   Thalia Bloodgood DO Internal Medicine Resident PGY-1 Pager [phone] Please contact the on call pager after 5 pm and on weekends at [phone].

## 2020-07-18 NOTE — Progress Notes (Signed)
STROKE TEAM PROGRESS NOTE   INTERVAL HISTORY Patient is sitting up in bed.  He has no complaints today.  He wants to go home.  He has been started on Coumadin with Lovenox bridge.  MRI scan shows no acute infarct but did show an old infarct.  CT angiograms showed no significant large vessel stenosis or occlusion.  2D echo showed slightly diminished ejection fraction of 45 to 50%.  LDL cholesterol is elevated 175 mg percent.  Patient appears to be at high risk for sleep apnea but states he has never been tested  Vitals:   07/17/20 1531 07/17/20 1843 07/18/20 0002 07/18/20 0437  BP: 138/87 132/81 135/70 (!) 127/95  Pulse: 70 75 64 65  Resp: 16 16 17 16   Temp: 98.3 F (36.8 C) 98.3 F (36.8 C) 98.2 F (36.8 C) 97.6 F (36.4 C)  TempSrc: Oral Oral Oral Oral  SpO2: 98% 94% 98% 95%  Weight:      Height:       CBC:  Recent Labs  Lab 07/17/20 1004 07/17/20 1004 07/17/20 1011 07/18/20 0857  WBC 4.0  --   --  3.4*  NEUTROABS 2.2  --   --   --   HGB 15.0   < > 15.6 15.5  HCT 44.2   < > 46.0 45.6  MCV 88.8  --   --  87.9  PLT 187  --   --  214   < > = values in this interval not displayed.   Basic Metabolic Panel:  Recent Labs  Lab 07/17/20 1004 07/17/20 1011  NA 137 140  K 4.8 4.2  CL 102 102  CO2 25  --   GLUCOSE 87 85  BUN 14 14  CREATININE 1.21 1.10  CALCIUM 9.3  --    Lipid Panel:  Recent Labs  Lab 07/17/20 1143  CHOL 240*  TRIG 117  HDL 42  CHOLHDL 5.7  VLDL 23  LDLCALC 07/19/20*   HgbA1c:  Recent Labs  Lab 07/17/20 1004  HGBA1C 5.0   Urine Drug Screen:  Recent Labs  Lab 07/17/20 1200  LABOPIA NONE DETECTED  COCAINSCRNUR NONE DETECTED  LABBENZ NONE DETECTED  AMPHETMU NONE DETECTED  THCU NONE DETECTED  LABBARB NONE DETECTED    Alcohol Level  Recent Labs  Lab 07/17/20 1000  ETH <10    IMAGING past 24 hours MR BRAIN WO CONTRAST  Result Date: 07/17/2020 CLINICAL DATA:  Acute neuro deficit with stroke suspected EXAM: MRI HEAD WITHOUT CONTRAST  TECHNIQUE: Multiplanar, multiecho pulse sequences of the brain and surrounding structures were obtained without intravenous contrast. COMPARISON:  CTA head neck from earlier today.  01/29/2016 brain MRI FINDINGS: Brain: Cluster of remote superior cerebellar infarcts. Small remote left parietal cortex infarct. Although there is some intermediate diffusion signal about the infarct, the appearance is overall chronic by CT and MRI and some of this diffusion signal may be related to chronic blood products at this level. A few additional areas of remote microhemorrhage in the peripheral left cerebrum, possibly from prior embolic events. No hydrocephalus, collection, or masslike finding. Vascular: Preserved flow voids.  There was preceding CTA. Skull and upper cervical spine: Normal marrow signal. Sinuses/Orbits: Bilateral posterior ethmoid sinus opacification. IMPRESSION: 1. No acute infarct. 2. 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 Electronically Signed   By: 03/30/2016 M.D.   On: 07/17/2020 11:26   DG Chest Portable 1 View  Result Date: 07/17/2020 CLINICAL DATA:  Dizziness with  blurred vision EXAM: PORTABLE CHEST 1 VIEW COMPARISON:  Chest x-ray of 08/05/2018 FINDINGS: Trachea midline. Cardiomediastinal contours accentuated by portable technique, top normal size. Signs of mitral valve replacement as before. Hilar structures are normal. Lungs are clear. No sign of consolidation. No sign of pleural effusion. No acute skeletal process to the extent evaluated IMPRESSION: No acute cardiopulmonary disease. Electronically Signed   By: Donzetta KohutGeoffrey  Wile M.D.   On: 07/17/2020 12:09   ECHOCARDIOGRAM COMPLETE  Result Date: 07/17/2020    ECHOCARDIOGRAM REPORT   Patient Name:   Benjamin FieldSHEDRICK Ancheta Date of Exam: 07/17/2020 Medical Rec #:  [id number]      Height:       69.0 in Accession #:    [id number]     Weight:       220.0 lb Date of Birth:  [birth year]     BSA:          2.151 m Patient Age:     34 years       BP:           139/66 mmHg Patient Gender: M              HR:           71 bpm. Exam Location:  Inpatient Procedure: 2D Echo Indications:    TIA  History:        Patient has prior history of Echocardiogram examinations, most                 recent 06/17/2019. CAD.                  Mitral Valve: St. Jude mechanical valve valve is present in the                 mitral position.  Sonographer:    Delcie RochLauren Pennington Referring Phys: [id number] RICHARD S DYKSTRA IMPRESSIONS  1. Septal hypokinesis. Left ventricular ejection fraction, by estimation, is 45 to 50%. The left ventricle has mildly decreased function. The left ventricle demonstrates global hypokinesis. Left ventricular diastolic parameters are indeterminate.  2. Right ventricular systolic function is normal. The right ventricular size is normal. There is normal pulmonary artery systolic pressure.  3. Mechanical mitral valve. Mean gradient 5 mmHg, essentially unchanged from 05/2019. Valve leaflets appear to be moving freely. . The mitral valve has been repaired/replaced. No evidence of mitral valve regurgitation. No evidence of mitral stenosis. The  mean mitral valve gradient is 5.0 mmHg. There is a St. Jude mechanical valve present in the mitral position.  4. The aortic valve is tricuspid. Aortic valve regurgitation is not visualized. No aortic stenosis is present.  5. The inferior vena cava is normal in size with greater than 50% respiratory variability, suggesting right atrial pressure of 3 mmHg. FINDINGS  Left Ventricle: Septal hypokinesis. Left ventricular ejection fraction, by estimation, is 45 to 50%. The left ventricle has mildly decreased function. The left ventricle demonstrates global hypokinesis. The left ventricular internal cavity size was normal in size. There is no left ventricular hypertrophy. Left ventricular diastolic parameters are indeterminate. Right Ventricle: The right ventricular size is normal. No increase in right ventricular  wall thickness. Right ventricular systolic function is normal. There is normal pulmonary artery systolic pressure. The tricuspid regurgitant velocity is 2.47 m/s, and  with an assumed right atrial pressure of 3 mmHg, the estimated right ventricular systolic pressure is 27.4 mmHg. Left Atrium: Left atrial size was normal in size. Right Atrium: Right atrial size was normal  in size. Pericardium: There is no evidence of pericardial effusion. Mitral Valve: Mechanical mitral valve. Mean gradient 5 mmHg, essentially unchanged from 05/2019. Valve leaflets appear to be moving freely. The mitral valve has been repaired/replaced. No evidence of mitral valve regurgitation. There is a St. Jude mechanical valve present in the mitral position. No evidence of mitral valve stenosis. MV peak gradient, 10.1 mmHg. The mean mitral valve gradient is 5.0 mmHg. Tricuspid Valve: The tricuspid valve is normal in structure. Tricuspid valve regurgitation is trivial. No evidence of tricuspid stenosis. Aortic Valve: The aortic valve is tricuspid. Aortic valve regurgitation is not visualized. No aortic stenosis is present. Pulmonic Valve: The pulmonic valve was normal in structure. Pulmonic valve regurgitation is not visualized. No evidence of pulmonic stenosis. Aorta: The aortic root is normal in size and structure. Venous: The inferior vena cava is normal in size with greater than 50% respiratory variability, suggesting right atrial pressure of 3 mmHg. IAS/Shunts: No atrial level shunt detected by color flow Doppler.  LEFT VENTRICLE PLAX 2D LVIDd:         5.20 cm LVIDs:         3.90 cm LV PW:         1.10 cm LV IVS:        0.90 cm LVOT diam:     2.20 cm LV SV:         61 LV SV Index:   28 LVOT Area:     3.80 cm  LV Volumes (MOD) LV vol d, MOD A4C: 99.0 ml LV vol s, MOD A4C: 55.7 ml LV SV MOD A4C:     99.0 ml RIGHT VENTRICLE            IVC RV S prime:     6.96 cm/s  IVC diam: 1.50 cm TAPSE (M-mode): 1.1 cm LEFT ATRIUM           Index       RIGHT  ATRIUM           Index LA Vol (A4C): 48.4 ml 22.50 ml/m RA Area:     10.50 cm                                   RA Volume:   20.10 ml  9.34 ml/m  AORTIC VALVE LVOT Vmax:   84.90 cm/s LVOT Vmean:  56.000 cm/s LVOT VTI:    0.161 m  AORTA Ao Root diam: 3.50 cm Ao Asc diam:  2.80 cm MITRAL VALVE                TRICUSPID VALVE MV Area (PHT): 2.91 cm     TR Peak grad:   24.4 mmHg MV Peak grad:  10.1 mmHg    TR Vmax:        247.00 cm/s MV Mean grad:  5.0 mmHg MV Vmax:       1.59 m/s     SHUNTS MV Vmean:      108.0 cm/s   Systemic VTI:  0.16 m MV Decel Time: 261 msec     Systemic Diam: 2.20 cm MV E velocity: 133.00 cm/s MV A velocity: 103.00 cm/s MV E/A ratio:  1.29 Chilton Si MD Electronically signed by Chilton Si MD Signature Date/Time: 07/17/2020/5:08:04 PM    Final     PHYSICAL EXAM Obese young African-American male not in distress. . Afebrile. Head is nontraumatic. Neck is supple without bruit.  Cardiac exam no murmur or gallop. Lungs are clear to auscultation. Distal pulses are well felt. Neurological Exam ;  Awake  Alert oriented x 3. Normal speech and language.eye movements full without nystagmus.fundi were not visualized. Vision acuity and fields appear normal. Hearing is normal. Palatal movements are normal. Face symmetric. Tongue midline. Normal strength, tone, reflexes and coordination. Normal sensation. Gait deferred. ASSESSMENT/PLAN Benjamin Rodriguez is a 34 y.o. male with history of recent MI, mitral valve replacememnt-supposed to be on Coumadin which he is probably noncompliant with, hypertension presenting with dizziness and blurred vision.   TIA, not compliant w/ AC in setting of artificial heart valve  Code Stroke CT head No acute abnormality. Remote L parietal infarct new since 2017.   CTA head & neck no ELVO. Notable Dental dz in setting of valve replacement  MRI  No acute infarct. 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  No source of embolus. Mechanical MV intact  LDL 175  HgbA1c 5.0  VTE prophylaxis - Lovenox 100 bid  warfarin daily prior to admission - ran out 2 weeks ago and missed coumadin clinic aptt, now on warfarin daily and with lovenox bridge. Continue long-territory AC. From stroke standpoint, Ok for home w/ lovenox bridge  Therapy recommendations:  No therapy needs  Disposition:  Return home  Hypertension  Stable . BP goal normotensive  Hyperlipidemia  Home meds:  lipitor 80 and lovaza, lipitor resumed in hospital  LDL 175, goal < 70  Continue statin and lovaza at discharge  Other Stroke Risk Factors  Obesity, Body mass index is 32.49 kg/m., recommend weight loss, diet and exercise as appropriate   Hx stroke/TIA - seen on imaging  Coronary artery disease  S/p PCI  Possible Obstructive sleep apnea, he snores, is interested in SleepSmart study - will need to be assessed by research group w/ [facility]. Dr. Pearlean Brownie will arrange evaluation.   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  I counseled the patient to be compliant with taking Coumadin otherwise he is at significant risk for recurrent strokes and TIAs.  Also need for aggressive risk factor modification with strict control of hypertension blood pressure goal below 130/90, diabetes with hemoglobin A1c goal below 6.5% and cholesterol with LDL cholesterol goal below 70 mg percent.  He also appears to be at high risk for sleep apnea and recommend outpatient evaluation with polysomnogram.  Agree with starting warfarin and bridging with Lovenox till INR is optimal between 2 and 3.  Greater than 50% time during this 25-minute visit was spent in counseling and coordination of care about his TIA and stroke risk and  mechanical heart valve and need for anticoagulation and compliance discussion answering questions.   Delia Heady, MD  To contact Stroke Continuity provider, please refer to [email or web address]. After hours, contact General Neurology

## 2020-07-18 NOTE — Discharge Instructions (Signed)
[name] thank you for allowing [place] to assist in your care during your admission. You were initially admitted for a suspected stroke and had a stroke work up. You had multiple images of your head performed that revealed a transient ischemic attack aka "mini stroke." This is most likely due to a clot that formed on your mechanical heart valve. Please continue to take your medications as prescribed.   Start: Lovenox 100mg , 70ml twice daily for 10 days. Stop on 07/28/20 Stop: Stop the coumadin until you finish the lovenox on 07/28/20. At this time, start taking coumadin again.

## 2020-07-18 NOTE — Progress Notes (Signed)
ANTICOAGULATION CONSULT NOTE   Pharmacy Consult for Warfarin Indication: Mechanical Valve   No Known Allergies  Patient Measurements: Height: 5\' 9"  (175.3 cm) Weight: 99.8 kg (220 lb) IBW/kg (Calculated) : 70.7 Heparin Dosing Weight:   Vital Signs: Temp: 97.6 F (36.4 C) (10/25 0437) Temp Source: Oral (10/25 0437) BP: 127/95 (10/25 0437) Pulse Rate: 65 (10/25 0437)  Labs: Recent Labs    07/17/20 1004 07/17/20 1004 07/17/20 1011 07/17/20 1143 07/18/20 0857  HGB 15.0   < > 15.6  --  15.5  HCT 44.2  --  46.0  --  45.6  PLT 187  --   --   --  214  APTT 32  --   --   --   --   LABPROT 13.4  --   --   --  14.4  INR 1.1  --   --   --  1.2  CREATININE 1.21  --  1.10  --   --   TROPONINIHS 6  --   --  7  --    < > = values in this interval not displayed.    Estimated Creatinine Clearance: 109.1 mL/min (by C-G formula based on SCr of 1.1 mg/dL).   Medical History: Past Medical History:  Diagnosis Date  . CAD (coronary artery disease)    a. 08/06/18- LHC- mechanical valve thromboembolic subtotal occlusion of distal-mid LAD with successful balloon angioplasty   . History of prosthetic mitral valve 2016  . Hypertension     Medications:  Scheduled:  . atorvastatin  80 mg Oral Daily  . enoxaparin (LOVENOX) injection  100 mg Subcutaneous Q12H  . warfarin  12 mg Oral ONCE-1600  . Warfarin - Pharmacist Dosing Inpatient   Does not apply q1600    Assessment: Patient is a 48 yom that presented to the ED after experiencing some dizziness. The patient was activated as a stroke and was found to have and ischemic stroke on MRI. With NIH of 0 risk of tPA was higher than benefit. Patient has a St jude valve and is on warfarin for this however states he has not taken in last couple weeks.   In sept therapeutic on: 18 mg Tues 12mg  ROW  Goal of Therapy:  INR 2.5-3.5 Monitor platelets by anticoagulation protocol: Yes   Plan:  -Warfarin 12 mg po x 1 dose tonight - Will also  start Lovenox 100mg  SubQ q12h until INR > 2.5 - Monitor patient for s/s of bleeding, PT-INR, and CBC   31, PharmD.  07/18/2020,10:26 AM

## 2020-07-19 ENCOUNTER — Telehealth: Payer: Self-pay | Admitting: *Deleted

## 2020-07-19 NOTE — Telephone Encounter (Signed)
Called pt since he has been discharged from the Hospital with a CVA and is on Lovenox injections. Pt needs an appt later this week as he was hospitalized on 07/17/20 with a CVA; discharged on Lovenox and Warfarin.  He was last seen in the Anticoagulation Clinic on 06/02/2020 and was due back on 06/16/2020; last noted phone call for the pt to call back to schedule his missed Anticoagulation Appt was on 07/07/2020.   Hospital dose/INR: 10/24-10mg  INR 1.1 & 10/25-d/c with Lovenox 100mg  BID, INR 1.2  Pt needs an appt for Friday, 07/22/2020; will await for the pt to call back or call the pt back another date.

## 2020-07-20 ENCOUNTER — Other Ambulatory Visit: Payer: Self-pay | Admitting: Cardiology

## 2020-07-20 MED ORDER — WARFARIN SODIUM 6 MG PO TABS: ORAL_TABLET | ORAL | 0 refills | Status: DC

## 2020-07-20 MED ORDER — CLOPIDOGREL BISULFATE 75 MG PO TABS
75.0000 mg | ORAL_TABLET | Freq: Every day | ORAL | 0 refills | Status: DC
Start: 2020-07-20 — End: 2020-09-07

## 2020-07-20 MED ORDER — ATORVASTATIN CALCIUM 80 MG PO TABS
80.0000 mg | ORAL_TABLET | Freq: Every day | ORAL | 0 refills | Status: DC
Start: 2020-07-20 — End: 2020-11-16

## 2020-07-20 MED FILL — CLOPIDOGREL 75 MG TABLET: 75 | 30 days supply | Qty: 30 | Fill #0

## 2020-07-20 MED FILL — WARFARIN SODIUM 6 MG TABLET: 6 | 7 days supply | Qty: 13 | Fill #0

## 2020-07-20 MED FILL — ATORVASTATIN 80 MG TABLET: 80 | 30 days supply | Qty: 30 | Fill #0

## 2020-07-20 NOTE — Telephone Encounter (Signed)
° ° ° °*  STAT* If patient is at the pharmacy, call can be transferred to refill team.   1. Which medications need to be refilled? (please list name of each medication and dose if known)   atorvastatin (LIPITOR) 80 MG tablet    clopidogrel (PLAVIX) 75 MG tablet    warfarin (COUMADIN) 6 MG tablet   2. Which pharmacy/location (including street and city if local pharmacy) is medication to be sent to? [facility] - [place], [place] - [address] [facility].  3. Do they need a 30 day or 90 day supply? 30 days

## 2020-07-20 NOTE — Telephone Encounter (Signed)
Pharmacy called and stated that the pt has not picked up his Lovenox or Warfarin; when I spoke to the pt he was traveling into town to pick it up. Will wait an hour and call the pharmacy to follow up as pt needs to start the medication.

## 2020-07-20 NOTE — Telephone Encounter (Signed)
Called the pharmacy back and they confirmed that the pt has picked up all his  meds including Warfarin and Lovenox.

## 2020-07-20 NOTE — Telephone Encounter (Addendum)
Called pt since he has not returned my phone for an appt. Spoke with pt and he states he just made an appt with the scheduler and it is for Monday, 07/25/2020. Patient states he DID NOT take any Warfarin yesterday because the hospital did not refill it or any of the medications and states he had a dose in the Hospital on Monday. Asked if he called in reference to not habving any of his meds and he states he did not. Also, he states he hasn't started the LOVENOX because it was unable to be filled at the pharmacy because it was NOT in stock. He is in town to pick up his Lovenox today and advised to start as soon as he can at 10am and 10pm and continue daily until appt. Also, advised once he picks up his Warfarin today he will do 3 tablets instead of 2 tablets then continue 2 tablets until appt on Monday. He is aware once we see him on Monday we can send in a full refill. Went over the importance of taking his meds as prescribed and staying adherent to appts. Stressed the importance of coming to appt on Monday and that another strokes, clot, bleeding, and fatalities can result if not monitored as recommended; pt verbalized understanding.

## 2020-07-20 NOTE — Discharge Summary (Addendum)
Name: Benjamin Rodriguez MRN: [id number] DOB: Aug 14, 1986 34 y.o. PCP: Patient, No Pcp Per  Date of Admission: 07/17/2020  9:37 AM Date of Discharge: 07/18/2020 Attending Physician: Dr. Cleda Daub  Discharge Diagnosis: Principal Problem:   TIA (transient ischemic attack) Active Problems:   H/O mitral valve replacement with mechanical valve   Dizziness   Subtherapeutic anticoagulation   Hyperlipidemia   Poor dentition    Discharge Medications: Allergies as of 07/18/2020   No Known Allergies      Medication List     TAKE these medications    atorvastatin 80 MG tablet Commonly known as: LIPITOR Take 1 tablet (80 mg total) by mouth daily.   clopidogrel 75 MG tablet Commonly known as: PLAVIX Take 1 tablet (75 mg total) by mouth daily.   enoxaparin 100 MG/ML injection Commonly known as: LOVENOX Inject 1 mL (100 mg total) into the skin every 12 (twelve) hours for 10 days.   metoprolol tartrate 25 MG tablet Commonly known as: LOPRESSOR Take 1 tablet (25 mg total) by mouth 2 (two) times daily.   omega-3 acid ethyl esters 1 g capsule Commonly known as: LOVAZA Take 1 capsule (1 g total) by mouth 2 (two) times daily.   warfarin 6 MG tablet Commonly known as: COUMADIN Take as directed. If you are unsure how to take this medication, talk to your nurse or doctor. Original instructions: Take 2 tablets daily except 3 tablets on Tuesdays or as directed by Anticoagulation Clinic.  Must keep OV for future refills, overdue for follow-up. What changed:  how much to take how to take this when to take this additional instructions        Disposition and follow-up:   [name] was discharged from [facility] in Stable condition.  At the hospital follow up visit please address:  1.  Follow-up:  A. Transient Ischemic Attack    B. Mechanical Heart Valve   C. Hyperlipidemia   D. Poor Dentition  2.  Labs / imaging needed at time of follow-up:  non-fasting LDL, INR by coumadin clinic  3.  Pending labs/ test needing follow-up: None  4.  Medication Changes  Started: Lovenox until INR therapeutic on coumadin. Continue home meds  Stopped: None  Abx - None   Follow-up Appointments:  Follow-up Information     [facility]. Schedule an appointment as soon as possible for a visit in 1 week(s).   Why: I will have the clinic schedule you a post hospital admission follow up appointment for next week.  Contact information: [address] [place] [address] [phone]               Hospital Course by problem list:  1.)Transient Ischemic Attack in Setting of Mechanical Mitral Valve Patient presented to ED due to transient episode of dizziness, lightheadedness,and loss of balance. Patient non-adherent to anticoagulation treatment for two weeks prior to symptoms as he was out of his coumadin. INR of 1.0. Upon admission, patient with mild dizziness, but unremarkable neurological examination. CT head no acute abnormality, remote left parietal infarct new since 2017. CTA negative for occlusion. 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 Echo revealed no source of embolus and mechanical mitral valve in place. Suspected patient had TIA in setting of mechanical valve with subtherapeutic INR levels. Patient denied being on any other medications, however, home medication list endorsed patient being prescribed statin therapy. Patient discharged home and  instructed to continue home medications, continue lovenox bridge with coumadin and follow up with coumadin clinic. Instructed patient on importance of continuing coumadin therapy. Patient denied having PCP, message sent to [facility] clinic front desk to schedule new patient/follow up appointment. Patient poor dentition and will need dental follow up in setting of mechanical valve.  Hyperlipidemia Patient denied being  prescribed statin, however, on home medication list. Continued atorvastatin during hospitalization. LDL of 175. Discussed importance of taking statin in context of recent TIA. Patient continued on home medication of lipitor and lovaza.   Discharge Vitals:   BP 126/87 (BP Location: Left Arm)   Pulse 67   Temp 98.2 F (36.8 C) (Oral)   Resp 16   Ht 5\' 9"  (1.753 m)   Wt 99.8 kg   SpO2 97%   BMI 32.49 kg/m   Pertinent Labs, Studies, and Procedures:  CBC Latest Ref Rng & Units 07/18/2020 07/17/2020 07/17/2020  WBC 4.0 - 10.5 K/uL 3.4(L) - 4.0  Hemoglobin 13.0 - 17.0 g/dL 07/19/2020 97.3 53.2  Hematocrit 39 - 52 % 45.6 46.0 44.2  Platelets 150 - 400 K/uL 214 - 187    CMP Latest Ref Rng & Units 07/17/2020 07/17/2020 11/25/2019  Glucose 70 - 99 mg/dL 85 87 86  BUN 6 - 20 mg/dL 14 14 7   Creatinine 0.61 - 1.24 mg/dL 01/25/2020 4.26  Sodium 135 - 145 mmol/L 140 137 140  Potassium 3.5 - 5.1 mmol/L 4.2 4.8 4.2  Chloride 98 - 111 mmol/L 102 102 103  CO2 22 - 32 mmol/L - 25 24  Calcium 8.9 - 10.3 mg/dL - 9.3 9.0  Total Protein 6.5 - 8.1 g/dL - 7.3 7.3  Total Bilirubin 0.3 - 1.2 mg/dL - 1.5(H) 0.7  Alkaline Phos 38 - 126 U/L - 49 64  AST 15 - 41 U/L - 45(H) 29  ALT 0 - 44 U/L - 38 30    CT Code Stroke CTA Head W/WO contrast  Result Date: 07/17/2020 CLINICAL DATA:  Dizziness. History of mitral bowel prosthesis with noncompliant anticoagulation EXAM: CT ANGIOGRAPHY HEAD AND NECK TECHNIQUE: Multidetector CT imaging of the head and neck was performed using the standard protocol during bolus administration of intravenous contrast. Multiplanar CT image reconstructions and MIPs were obtained to evaluate the vascular anatomy. Carotid stenosis measurements (when applicable) are obtained utilizing NASCET criteria, using the distal internal carotid diameter as the denominator. CONTRAST:  55mL OMNIPAQUE IOHEXOL 350 MG/ML SOLN COMPARISON:  Preceding head CT. FINDINGS: CTA NECK FINDINGS Aortic arch: 2 vessel  branching.  No acute finding. Right carotid system: Vessels are smooth and widely patent. Left carotid system: Vessels are smooth and widely patent. Vertebral arteries: Strong left vertebral artery dominance. No subclavian stenosis. No dissection, beading, or stenosis. Skeleton: No acute or aggressive finding. Periodontal erosion associated with the lower left second molar. There are bilateral molar cavities. Other neck: 19 mm left thyroid nodule which could be a cyst with thin septation Upper chest: No acute finding Review of the MIP images confirms the above findings CTA HEAD FINDINGS Anterior circulation: Vessels are smooth and widely patent. No aneurysm. Posterior circulation: Strong left vertebral artery dominance. The right vertebral artery service a tiny right PICA. The vertebral and basilar arteries are smooth and diffusely patent. No PCA branch occlusion or aneurysm. Venous sinuses: Patent as permitted by contrast timing Anatomic variants: None significant Review of the MIP images confirms the above findings IMPRESSION: 1. No emergent finding or arterial stenosis. 2. Dental disease which  is notable in the setting of valve replacement. Electronically Signed   By: Marnee SpringJonathon  Watts M.D.   On: 07/17/2020 10:49   CT Code Stroke CTA Neck W/WO contrast  Result Date: 07/17/2020 CLINICAL DATA:  Dizziness. History of mitral bowel prosthesis with noncompliant anticoagulation EXAM: CT ANGIOGRAPHY HEAD AND NECK TECHNIQUE: Multidetector CT imaging of the head and neck was performed using the standard protocol during bolus administration of intravenous contrast. Multiplanar CT image reconstructions and MIPs were obtained to evaluate the vascular anatomy. Carotid stenosis measurements (when applicable) are obtained utilizing NASCET criteria, using the distal internal carotid diameter as the denominator. CONTRAST:  65mL OMNIPAQUE IOHEXOL 350 MG/ML SOLN COMPARISON:  Preceding head CT. FINDINGS: CTA NECK FINDINGS Aortic  arch: 2 vessel branching.  No acute finding. Right carotid system: Vessels are smooth and widely patent. Left carotid system: Vessels are smooth and widely patent. Vertebral arteries: Strong left vertebral artery dominance. No subclavian stenosis. No dissection, beading, or stenosis. Skeleton: No acute or aggressive finding. Periodontal erosion associated with the lower left second molar. There are bilateral molar cavities. Other neck: 19 mm left thyroid nodule which could be a cyst with thin septation Upper chest: No acute finding Review of the MIP images confirms the above findings CTA HEAD FINDINGS Anterior circulation: Vessels are smooth and widely patent. No aneurysm. Posterior circulation: Strong left vertebral artery dominance. The right vertebral artery service a tiny right PICA. The vertebral and basilar arteries are smooth and diffusely patent. No PCA branch occlusion or aneurysm. Venous sinuses: Patent as permitted by contrast timing Anatomic variants: None significant Review of the MIP images confirms the above findings IMPRESSION: 1. No emergent finding or arterial stenosis. 2. 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 Electronically Signed   By: Marnee SpringJonathon  Watts M.D.   On: 07/17/2020 10:49   MR BRAIN WO CONTRAST  Result Date: 07/17/2020 CLINICAL DATA:  Acute neuro deficit with stroke suspected EXAM: MRI HEAD WITHOUT CONTRAST TECHNIQUE: Multiplanar, multiecho pulse sequences of the brain and surrounding structures were obtained without intravenous contrast. COMPARISON:  CTA head neck from earlier today.  01/29/2016 brain MRI FINDINGS: Brain: Cluster of remote superior cerebellar infarcts. Small remote left parietal cortex infarct. Although there is some intermediate diffusion signal about the infarct, the appearance is overall chronic by CT and MRI and some of this diffusion signal may be related to chronic blood products at this level. A few additional areas of remote  microhemorrhage in the peripheral left cerebrum, possibly from prior embolic events. No hydrocephalus, collection, or masslike finding. Vascular: Preserved flow voids.  There was preceding CTA. Skull and upper cervical spine: Normal marrow signal. Sinuses/Orbits: Bilateral posterior ethmoid sinus opacification. IMPRESSION: 1. No acute infarct. 2. 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 Electronically Signed   By: Marnee SpringJonathon  Watts M.D.   On: 07/17/2020 11:26   DG Chest Portable 1 View  Result Date: 07/17/2020 CLINICAL DATA:  Dizziness with blurred vision EXAM: PORTABLE CHEST 1 VIEW COMPARISON:  Chest x-ray of 08/05/2018 FINDINGS: Trachea midline. Cardiomediastinal contours accentuated by portable technique, top normal size. Signs of mitral valve replacement as before. Hilar structures are normal. Lungs are clear. No sign of consolidation. No sign of pleural effusion. No acute skeletal process to the extent evaluated IMPRESSION: No acute cardiopulmonary disease. Electronically Signed   By: Donzetta KohutGeoffrey  Wile M.D.   On: 07/17/2020 12:09   ECHOCARDIOGRAM COMPLETE  Result Date: 07/17/2020    ECHOCARDIOGRAM REPORT   Patient  Name:   DAMONTE FRIESON Date of Exam: 07/17/2020 Medical Rec #:  [id number]      Height:       69.0 in Accession #:    [id number]     Weight:       220.0 lb Date of Birth:  1986-06-01     BSA:          2.151 m Patient Age:    34 years       BP:           139/66 mmHg Patient Gender: M              HR:           71 bpm. Exam Location:  Inpatient Procedure: 2D Echo Indications:    TIA  History:        Patient has prior history of Echocardiogram examinations, most                 recent 06/17/2019. CAD.                  Mitral Valve: St. Jude mechanical valve valve is present in the                 mitral position.  Sonographer:    Delcie Roch Referring Phys: [id number] RICHARD S DYKSTRA IMPRESSIONS  1. Septal hypokinesis. Left ventricular ejection fraction, by  estimation, is 45 to 50%. The left ventricle has mildly decreased function. The left ventricle demonstrates global hypokinesis. Left ventricular diastolic parameters are indeterminate.  2. Right ventricular systolic function is normal. The right ventricular size is normal. There is normal pulmonary artery systolic pressure.  3. Mechanical mitral valve. Mean gradient 5 mmHg, essentially unchanged from 05/2019. Valve leaflets appear to be moving freely. . The mitral valve has been repaired/replaced. No evidence of mitral valve regurgitation. No evidence of mitral stenosis. The  mean mitral valve gradient is 5.0 mmHg. There is a St. Jude mechanical valve present in the mitral position.  4. The aortic valve is tricuspid. Aortic valve regurgitation is not visualized. No aortic stenosis is present.  5. The inferior vena cava is normal in size with greater than 50% respiratory variability, suggesting right atrial pressure of 3 mmHg. FINDINGS  Left Ventricle: Septal hypokinesis. Left ventricular ejection fraction, by estimation, is 45 to 50%. The left ventricle has mildly decreased function. The left ventricle demonstrates global hypokinesis. The left ventricular internal cavity size was normal in size. There is no left ventricular hypertrophy. Left ventricular diastolic parameters are indeterminate. Right Ventricle: The right ventricular size is normal. No increase in right ventricular wall thickness. Right ventricular systolic function is normal. There is normal pulmonary artery systolic pressure. The tricuspid regurgitant velocity is 2.47 m/s, and  with an assumed right atrial pressure of 3 mmHg, the estimated right ventricular systolic pressure is 27.4 mmHg. Left Atrium: Left atrial size was normal in size. Right Atrium: Right atrial size was normal in size. Pericardium: There is no evidence of pericardial effusion. Mitral Valve: Mechanical mitral valve. Mean gradient 5 mmHg, essentially unchanged from 05/2019. Valve  leaflets appear to be moving freely. The mitral valve has been repaired/replaced. No evidence of mitral valve regurgitation. There is a St. Jude mechanical valve present in the mitral position. No evidence of mitral valve stenosis. MV peak gradient, 10.1 mmHg. The mean mitral valve gradient is 5.0 mmHg. Tricuspid Valve: The tricuspid valve is normal in structure. Tricuspid valve regurgitation is trivial. No evidence of tricuspid  stenosis. Aortic Valve: The aortic valve is tricuspid. Aortic valve regurgitation is not visualized. No aortic stenosis is present. Pulmonic Valve: The pulmonic valve was normal in structure. Pulmonic valve regurgitation is not visualized. No evidence of pulmonic stenosis. Aorta: The aortic root is normal in size and structure. Venous: The inferior vena cava is normal in size with greater than 50% respiratory variability, suggesting right atrial pressure of 3 mmHg. IAS/Shunts: No atrial level shunt detected by color flow Doppler.  LEFT VENTRICLE PLAX 2D LVIDd:         5.20 cm LVIDs:         3.90 cm LV PW:         1.10 cm LV IVS:        0.90 cm LVOT diam:     2.20 cm LV SV:         61 LV SV Index:   28 LVOT Area:     3.80 cm  LV Volumes (MOD) LV vol d, MOD A4C: 99.0 ml LV vol s, MOD A4C: 55.7 ml LV SV MOD A4C:     99.0 ml RIGHT VENTRICLE            IVC RV S prime:     6.96 cm/s  IVC diam: 1.50 cm TAPSE (M-mode): 1.1 cm LEFT ATRIUM           Index       RIGHT ATRIUM           Index LA Vol (A4C): 48.4 ml 22.50 ml/m RA Area:     10.50 cm                                   RA Volume:   20.10 ml  9.34 ml/m  AORTIC VALVE LVOT Vmax:   84.90 cm/s LVOT Vmean:  56.000 cm/s LVOT VTI:    0.161 m  AORTA Ao Root diam: 3.50 cm Ao Asc diam:  2.80 cm MITRAL VALVE                TRICUSPID VALVE MV Area (PHT): 2.91 cm     TR Peak grad:   24.4 mmHg MV Peak grad:  10.1 mmHg    TR Vmax:        247.00 cm/s MV Mean grad:  5.0 mmHg MV Vmax:       1.59 m/s     SHUNTS MV Vmean:      108.0 cm/s   Systemic VTI:  0.16  m MV Decel Time: 261 msec     Systemic Diam: 2.20 cm MV E velocity: 133.00 cm/s MV A velocity: 103.00 cm/s MV E/A ratio:  1.29 Chilton Si MD Electronically signed by Chilton Si MD Signature Date/Time: 07/17/2020/5:08:04 PM    Final    CT HEAD CODE STROKE WO CONTRAST  Result Date: 07/17/2020 CLINICAL DATA:  Code stroke.  Dizziness EXAM: CT HEAD WITHOUT CONTRAST TECHNIQUE: Contiguous axial images were obtained from the base of the skull through the vertex without intravenous contrast. COMPARISON:  01/29/2016 brain MRI FINDINGS: Brain: Bifrontal gray-white differentiation is poor relative to the remainder of the brain but attributed to artifact based on reformats. Small remote left parietal cortex infarct. No evidence of hemorrhage, hydrocephalus, or masslike finding Vascular: Prominent density of the basilar but not certainly hyperdense when compared to the carotid arteries. Skull: Negative Sinuses/Orbits: Negative Other: These results were communicated to Dr. Wilford Corner at [facility]  10/24/2021by text page via the [facility] messaging system. ASPECTS [facility] Stroke Program Early CT Score) Not scored with this history IMPRESSION: 1. No acute finding. 2. Small remote left parietal infarct that is new from a 2017 brain MRI. Electronically Signed   By: Marnee Spring M.D.   On: 07/17/2020 10:39    Discharge Instructions: Discharge Instructions     Call MD for:  difficulty breathing, headache or visual disturbances   Complete by: As directed    Call MD for:  extreme fatigue   Complete by: As directed    Call MD for:  persistant dizziness or light-headedness   Complete by: As directed    Call MD for:  persistant nausea and vomiting   Complete by: As directed    Call MD for:  redness, tenderness, or signs of infection (pain, swelling, redness, odor or green/yellow discharge around incision site)   Complete by: As directed    Call MD for:  severe uncontrolled pain   Complete by: As directed    Call  MD for:  temperature >100.4   Complete by: As directed    Diet - low sodium heart healthy   Complete by: As directed    Increase activity slowly   Complete by: As directed       Signed: Belva Agee, MD 07/20/2020, 8:17 AM   Pager: [phone]

## 2020-07-20 NOTE — Telephone Encounter (Signed)
Spoke with pt regarding Warfarin refill; please see ongoing note from 07/19/20.

## 2020-07-20 NOTE — Telephone Encounter (Signed)
Pt's medications were sent to pt's pharmacy as requested. Confirmation received.  

## 2020-07-21 ENCOUNTER — Telehealth: Payer: Self-pay | Admitting: General Practice

## 2020-07-21 NOTE — Telephone Encounter (Signed)
-----   Message from Belva Agee, MD sent at 07/19/2020  9:31 AM EDT ----- Good morning! Can you please schedule Mr. Felber for a post-hospitalization - follow up TIA , appointment for early next week? This patient would be new to our clinic, he has no PCP.  Thank you!  Vasili Katsadouros

## 2020-07-21 NOTE — Telephone Encounter (Signed)
TOC  Called patient no answer. Appointment scheduled for 07/29/2020 at 3:30 pm.  Message on voicemail with appt info and card mailed to patient.

## 2020-07-22 NOTE — Telephone Encounter (Signed)
Transition Care Management Unsuccessful Follow-up Telephone Call  Date of discharge and from where:  07/18/20 from [facility]  Attempts:  1st Attempt  Reason for unsuccessful TCM follow-up call:  Unable to reach patient

## 2020-07-25 ENCOUNTER — Other Ambulatory Visit: Payer: Self-pay | Admitting: Cardiology

## 2020-07-25 ENCOUNTER — Ambulatory Visit (INDEPENDENT_AMBULATORY_CARE_PROVIDER_SITE_OTHER): Payer: 59 | Admitting: *Deleted

## 2020-07-25 ENCOUNTER — Other Ambulatory Visit: Payer: Self-pay

## 2020-07-25 DIAGNOSIS — Z5181 Encounter for therapeutic drug level monitoring: Secondary | ICD-10-CM | POA: Diagnosis not present

## 2020-07-25 DIAGNOSIS — Z952 Presence of prosthetic heart valve: Secondary | ICD-10-CM

## 2020-07-25 DIAGNOSIS — E785 Hyperlipidemia, unspecified: Secondary | ICD-10-CM | POA: Diagnosis present

## 2020-07-25 DIAGNOSIS — K089 Disorder of teeth and supporting structures, unspecified: Secondary | ICD-10-CM

## 2020-07-25 LAB — POCT INR: INR: 2.5 (ref 2.0–3.0)

## 2020-07-25 MED ORDER — WARFARIN SODIUM 6 MG PO TABS: ORAL_TABLET | ORAL | 0 refills | Status: DC

## 2020-07-25 MED FILL — WARFARIN SODIUM 6 MG TABLET: 6 | 30 days supply | Qty: 65 | Fill #0

## 2020-07-25 NOTE — Patient Instructions (Signed)
Description   Stop Lovenox injections. Continue taking 2 tablets daily except for 3 tablets on Tuesdays. Recheck INR in 1 week. Coumadin Clinic [phone] Main [phone]

## 2020-07-29 ENCOUNTER — Ambulatory Visit: Payer: 59 | Admitting: Student

## 2020-07-29 DIAGNOSIS — Z952 Presence of prosthetic heart valve: Secondary | ICD-10-CM | POA: Diagnosis not present

## 2020-07-29 DIAGNOSIS — K089 Disorder of teeth and supporting structures, unspecified: Secondary | ICD-10-CM

## 2020-07-29 DIAGNOSIS — E785 Hyperlipidemia, unspecified: Secondary | ICD-10-CM

## 2020-07-29 NOTE — Assessment & Plan Note (Signed)
Assessment: History of mitral valve replacement with mechanical valve, unknown as to why he had this procedure. Patient on warfarin and clopidogrel at this time, states he has been compliant. Recent INR of 2.5  Plan: Continue warfarin and clopidogrel Continue Anticoag clinic

## 2020-07-29 NOTE — Assessment & Plan Note (Signed)
Assessment: Patient with history of being poor dentition. Endorses seeing a dentist recently who wanted patient to follow up with a deep cleaning. Denies following up yet, but states he will.    Plan: F/u with dentist for deep cleaning

## 2020-07-29 NOTE — Assessment & Plan Note (Signed)
Assessment: Lab Results  Component Value Date   CHOL 240 (H) 07/17/2020   HDL 42 07/17/2020   LDLCALC 175 (H) 07/17/2020   TRIG 117 07/17/2020   CHOLHDL 5.7 07/17/2020  Patient nonadherent to statin in the past. Restarted atorvastatin 80 mg while in the hospital. Patient endorses compliance  Plan: Continue atorvastatin 80 mg

## 2020-07-29 NOTE — Patient Instructions (Signed)
Thank you, Mr.Shawne Dumais for allowing [place] to provide your care today. Today we discussed your post hospital follow up. I am glad to hear you are doing well and continuing to take your prescribed medications. We would like to see you in 6 weeks to repeat your cholesterol labs.     I have ordered the following labs for you:  Lab Orders  No laboratory test(s) ordered today     Tests ordered today:  None  Referrals ordered today:   Referral Orders  No referral(s) requested today     I have ordered the following medication/changed the following medications:   Stop the following medications: There are no discontinued medications.   Start the following medications: No orders of the defined types were placed in this encounter.    Follow up: 6 weeks    Remember: Continue to take your medicines!   Should you have any questions or concerns please call the internal medicine clinic at [phone].     Thalia Bloodgood, D.O. [facility]

## 2020-07-29 NOTE — Progress Notes (Signed)
CC: TIA follow up  HPI:  [name] is a 34 y.o. male with a past medical history stated below and presents today for post hospitalization follow up for TIA. Please see problem based assessment and plan for additional details.  Past Medical History:  Diagnosis Date   CAD (coronary artery disease)    a. 08/06/18- LHC- mechanical valve thromboembolic subtotal occlusion of distal-mid LAD with successful balloon angioplasty    History of prosthetic mitral valve 2016   Hypertension     Current Outpatient Medications on File Prior to Visit  Medication Sig Dispense Refill   atorvastatin (LIPITOR) 80 MG tablet Take 1 tablet (80 mg total) by mouth daily. Please make overdue appt with Dr. Delton See before anymore refills. Thank you 1st attempt 30 tablet 0   clopidogrel (PLAVIX) 75 MG tablet Take 1 tablet (75 mg total) by mouth daily. Please make overdue appt with Dr. Delton See before anymore refills. Thank you 1st attempt 30 tablet 0   enoxaparin (LOVENOX) 100 MG/ML injection Inject 1 mL (100 mg total) into the skin every 12 (twelve) hours for 10 days. 20 mL 0   metoprolol tartrate (LOPRESSOR) 25 MG tablet Take 1 tablet (25 mg total) by mouth 2 (two) times daily. (Patient not taking: Reported on 07/17/2020) 180 tablet 3   omega-3 acid ethyl esters (LOVAZA) 1 g capsule Take 1 capsule (1 g total) by mouth 2 (two) times daily. (Patient not taking: Reported on 07/17/2020) 180 capsule 3   warfarin (COUMADIN) 6 MG tablet Take 2 tablets daily except 3 tablets on Tuesdays or as directed by Anticoagulation Clinic. 65 tablet 0   No current facility-administered medications on file prior to visit.    Family History  Problem Relation Age of Onset   Hypertension Father    Heart attack Father    Breast cancer Maternal Grandmother    Aneurysm Maternal Grandmother    Clotting disorder Neg Hx    Anesthesia problems Neg Hx     Social History   Socioeconomic History   Marital status:  Single    Spouse name: Not on file   Number of children: Not on file   Years of education: Not on file   Highest education level: Not on file  Occupational History   Occupation: works at [employer] in the prior [place] department  Tobacco Use   Smoking status: Never Smoker   Smokeless tobacco: Never Used  Building services engineer Use: Never used  Substance and Sexual Activity   Alcohol use: Yes   Drug use: Never   Sexual activity: Not on file  Other Topics Concern   Not on file  Social History Narrative   Not on file   Social Determinants of Health   Financial Resource Strain:    Difficulty of Paying Living Expenses: Not on file  Food Insecurity:    Worried About Programme researcher, broadcasting/film/video in the Last Year: Not on file   [employer] of Food in the Last Year: Not on file  Transportation Needs:    Lack of Transportation (Medical): Not on file   Lack of Transportation (Non-Medical): Not on file  Physical Activity:    Days of Exercise per Week: Not on file   Minutes of Exercise per Session: Not on file  Stress:    Feeling of Stress : Not on file  Social Connections:    Frequency of Communication with Friends and Family: Not on file   Frequency of Social Gatherings with Friends  and Family: Not on file   Attends Religious Services: Not on file   Active Member of Clubs or Organizations: Not on file   Attends Banker Meetings: Not on file   Marital Status: Not on file  Intimate Partner Violence:    Fear of Current or Ex-Partner: Not on file   Emotionally Abused: Not on file   Physically Abused: Not on file   Sexually Abused: Not on file    Review of Systems: ROS negative except for what is noted on the assessment and plan.  Vitals:   07/29/20 1542  BP: (!) 144/78  Pulse: 73  Temp: 98.9 F (37.2 C)  TempSrc: Oral  SpO2: 97%  Weight: 223 lb 14.4 oz (101.6 kg)     Physical Exam: Physical Exam Cardiovascular:     Rate and Rhythm: Normal rate  and regular rhythm.     Heart sounds: No murmur heard.  No friction rub. No gallop.      Comments: Mitral Click present Pulmonary:     Effort: Pulmonary effort is normal. No respiratory distress.  Skin:    General: Skin is warm and dry.  Neurological:     General: No focal deficit present.     Mental Status: He is oriented to person, place, and time. Mental status is at baseline.      Assessment & Plan:   See Encounters Tab for problem based charting.  Patient seen with Dr. Dario Ave, D.O. [facility] Internal Medicine, PGY-1 Pager: [phone], Phone: [phone] Date 07/29/2020 Time 5:21 PM

## 2020-08-02 NOTE — Progress Notes (Signed)
Internal Medicine Clinic Attending  I saw and evaluated the patient.  I personally confirmed the key portions of the history and exam documented by Dr. Katsadouros and I reviewed pertinent patient test results.  The assessment, diagnosis, and plan were formulated together and I agree with the documentation in the resident's note.  

## 2020-08-03 ENCOUNTER — Other Ambulatory Visit: Payer: Self-pay | Admitting: Cardiology

## 2020-08-03 ENCOUNTER — Encounter: Payer: Self-pay | Admitting: Cardiology

## 2020-08-03 ENCOUNTER — Ambulatory Visit: Payer: 59 | Admitting: Cardiology

## 2020-08-03 ENCOUNTER — Ambulatory Visit (INDEPENDENT_AMBULATORY_CARE_PROVIDER_SITE_OTHER): Payer: 59 | Admitting: *Deleted

## 2020-08-03 ENCOUNTER — Other Ambulatory Visit: Payer: Self-pay

## 2020-08-03 VITALS — BP 142/90 | HR 76 | Ht 69.0 in | Wt 222.0 lb

## 2020-08-03 DIAGNOSIS — Z5181 Encounter for therapeutic drug level monitoring: Secondary | ICD-10-CM | POA: Diagnosis not present

## 2020-08-03 DIAGNOSIS — I214 Non-ST elevation (NSTEMI) myocardial infarction: Secondary | ICD-10-CM

## 2020-08-03 DIAGNOSIS — Z952 Presence of prosthetic heart valve: Secondary | ICD-10-CM | POA: Diagnosis not present

## 2020-08-03 DIAGNOSIS — Z9114 Patient's other noncompliance with medication regimen: Secondary | ICD-10-CM | POA: Diagnosis not present

## 2020-08-03 DIAGNOSIS — E785 Hyperlipidemia, unspecified: Secondary | ICD-10-CM | POA: Diagnosis not present

## 2020-08-03 DIAGNOSIS — G459 Transient cerebral ischemic attack, unspecified: Secondary | ICD-10-CM

## 2020-08-03 DIAGNOSIS — I10 Essential (primary) hypertension: Secondary | ICD-10-CM | POA: Diagnosis not present

## 2020-08-03 LAB — POCT INR: INR: 1.6 — AB (ref 2.0–3.0)

## 2020-08-03 MED ORDER — ENTRESTO 24-26 MG PO TABS: 1.0000 | ORAL_TABLET | Freq: Two times a day (BID) | ORAL | 4 refills | Status: DC

## 2020-08-03 MED FILL — ENTRESTO 24 MG-26 MG TABLET: 24-26 | 30 days supply | Qty: 60 | Fill #0

## 2020-08-03 NOTE — Patient Instructions (Addendum)
Medication Instructions:   START TAKING ENTRESTO 24-26 MG BY MOUTH TWICE DAILY  *If you need a refill on your cardiac medications before your next appointment, please call your pharmacy*   Follow-Up:  3 MONTHS IN THE OFFICE WITH AN EXTENDER

## 2020-08-03 NOTE — Progress Notes (Signed)
Cardiology Office Note    Date:  08/03/2020   ID:  [name], DOB [birth year], MRN [id number]  PCP:  Patient, No Pcp Per  Cardiologist: Tobias Alexander, MD EPS: None  Reason for visit: Posthospitalization follow-up post stroke  History of Present Illness:  Benjamin Rodriguez is a 34 y.o. male with history of severe MR S/P St. Jude MVR at [facility] 2016.Stopped his Coumadin 2 months ago when he moved to [place].  Patient was admitted with NSTEMI felt most probably secondary to embolization from mechanical valve thrombosis.  Suspect thromboembolic sub-total occlusion of the distal mid LAD with successful balloon angioplasty restoring TIMI-3 flow.  This resulted in occlusion of a 1 mm distal diagonal branch.  LVEF with apical anterior hypokinesis.  He was restarted on Coumadin.  2D echo showed akinesis of the apical walls overall LVEF 40 to 55% mean gradient across the mitral valve 4 mmHg.  Plan is for Plavix, aspirin and Coumadin for a minimum of 1 month and then another year of Plavix and Coumadin and after that Coumadin alone.  The patient has been doing well until 07/18/2020 when he developed symptoms of transient ischemic attack with dizziness and loss of balance as well as visual changes.  He admits to not taking Coumadin 2 weeks prior to that event.  His INR on admission was 1.2.  CT showed no new changes but chronic left parietal infarct that was present in 2017.  CTA showed no occlusion of arteries.  MRI showed no acute findings.  He was restarted on Coumadin bridged with Lovenox, his symptoms have resolved within 24 hours.  He states that he has been taking his medication since then and that his symptoms have completely resolved.  He denies any recent chest pain.  No lower extremity edema orthopnea proximal nocturnal dyspnea.  Past Medical History:  Diagnosis Date  . CAD (coronary artery disease)    a. 08/06/18- LHC- mechanical valve thromboembolic subtotal occlusion of distal-mid LAD  with successful balloon angioplasty   . History of prosthetic mitral valve 2016  . Hypertension     Past Surgical History:  Procedure Laterality Date  . CARDIAC SURGERY    . CORONARY BALLOON ANGIOPLASTY N/A 08/06/2018   Procedure: CORONARY BALLOON ANGIOPLASTY;  Surgeon: Marykay Lex, MD;  Location: [facility] INVASIVE CV LAB;  Service: Cardiovascular;  Laterality: N/A;  . LEFT HEART CATH AND CORONARY ANGIOGRAPHY N/A 08/06/2018   Procedure: LEFT HEART CATH AND CORONARY ANGIOGRAPHY;  Surgeon: Marykay Lex, MD;  Location: [facility] INVASIVE CV LAB;  Service: Cardiovascular;  Laterality: N/A;  . MITRAL VALVE REPLACEMENT      Current Medications: Current Meds  Medication Sig  . atorvastatin (LIPITOR) 80 MG tablet Take 1 tablet (80 mg total) by mouth daily. Please make overdue appt with Dr. Delton See before anymore refills. Thank you 1st attempt  . clopidogrel (PLAVIX) 75 MG tablet Take 1 tablet (75 mg total) by mouth daily. Please make overdue appt with Dr. Delton See before anymore refills. Thank you 1st attempt  . metoprolol tartrate (LOPRESSOR) 25 MG tablet Take 1 tablet (25 mg total) by mouth 2 (two) times daily.  Marland Kitchen omega-3 acid ethyl esters (LOVAZA) 1 g capsule Take 1 capsule (1 g total) by mouth 2 (two) times daily.  Marland Kitchen warfarin (COUMADIN) 6 MG tablet Take 2 tablets daily except 3 tablets on Tuesdays or as directed by Anticoagulation Clinic.     Allergies:   Patient has no known allergies.   Social History   Socioeconomic  History  . Marital status: Single    Spouse name: Not on file  . Number of children: Not on file  . Years of education: Not on file  . Highest education level: Not on file  Occupational History  . Occupation: works at [employer] in the prior [place] department  Tobacco Use  . Smoking status: Never Smoker  . Smokeless tobacco: Never Used  Vaping Use  . Vaping Use: Never used  Substance and Sexual Activity  . Alcohol use: Yes  . Drug use: Never  . Sexual activity: Not on file   Other Topics Concern  . Not on file  Social History Narrative  . Not on file   Social Determinants of Health   Financial Resource Strain:   . Difficulty of Paying Living Expenses: Not on file  Food Insecurity:   . Worried About Programme researcher, broadcasting/film/videounning Out of Food in the Last Year: Not on file  . Ran Out of Food in the Last Year: Not on file  Transportation Needs:   . Lack of Transportation (Medical): Not on file  . Lack of Transportation (Non-Medical): Not on file  Physical Activity:   . Days of Exercise per Week: Not on file  . Minutes of Exercise per Session: Not on file  Stress:   . Feeling of Stress : Not on file  Social Connections:   . Frequency of Communication with Friends and Family: Not on file  . Frequency of Social Gatherings with Friends and Family: Not on file  . Attends Religious Services: Not on file  . Active Member of Clubs or Organizations: Not on file  . Attends BankerClub or Organization Meetings: Not on file  . Marital Status: Not on file    Family History:  The patient's family history includes Aneurysm in his maternal grandmother; Breast cancer in his maternal grandmother; Heart attack in his father; Hypertension in his father.   ROS:   Please see the history of present illness.    Review of Systems  Constitution: Negative.  HENT: Negative.   Cardiovascular: Negative.   Respiratory: Negative.   Endocrine: Negative.   Hematologic/Lymphatic: Negative.   Musculoskeletal: Negative.   Gastrointestinal: Positive for dysphagia.  Genitourinary: Negative.   Neurological: Negative.    All other systems reviewed and are negative.  PHYSICAL EXAM:   VS:  BP (!) 142/90   Pulse 76   Ht 5\' 9"  (1.753 m)   Wt 222 lb (100.7 kg)   SpO2 95%   BMI 32.78 kg/m    Physical Exam  GEN: Well nourished, well developed, in no acute distress  Neck: no JVD, carotid bruits, or masses Cardiac:RRR; crisp valvular click at the apex Respiratory:  clear to auscultation bilaterally, normal  work of breathing GI: soft, nontender, nondistended, + BS Ext: Right arm without hematoma or hemorrhage at cath site, good radial brachial pulses, lower extremities without cyanosis, clubbing, or edema, Good distal pulses bilaterally Neuro:  Alert and Oriented x 3,  Psych: euthymic mood, full affect  Wt Readings from Last 3 Encounters:  08/03/20 222 lb (100.7 kg)  07/29/20 223 lb 14.4 oz (101.6 kg)  07/17/20 220 lb (99.8 kg)    Studies/Labs Reviewed:   EKG:  EKG is ordered today.  Sinus rhythm, incomplete right bundle branch block, unchanged from prior.  This was personally reviewed.   Recent Labs: 11/25/2019: TSH 3.820 07/17/2020: ALT 38; BUN 14; Creatinine, Ser 1.10; Potassium 4.2; Sodium 140 07/18/2020: Hemoglobin 15.5; Platelets 214   Lipid Panel  Component Value Date/Time   CHOL 240 (H) 07/17/2020 1143   CHOL 153 12/24/2019 0904   TRIG 117 07/17/2020 1143   HDL 42 07/17/2020 1143   HDL 39 (L) 12/24/2019 0904   CHOLHDL 5.7 07/17/2020 1143   VLDL 23 07/17/2020 1143   LDLCALC 175 (H) 07/17/2020 1143   LDLCALC 91 12/24/2019 0904    Additional studies/ records that were reviewed today include:  Echocardiogram 08/06/2018 Study Conclusions - Left ventricle: The cavity size was normal. Wall thickness was   increased in a pattern of mild LVH. Systolic function was normal.   The estimated ejection fraction was in the range of 50% to 55%.   The study is not technically sufficient to allow evaluation of LV   diastolic function. - Mitral valve: Mechanical MVR - normal leaflet motion. No   obstruction. Mean gradient (D): 4 mm Hg. Valve area by pressure   half-time: 2.29 cm^2. Valve area by continuity equation (using   LVOT flow): 1.12 cm^2. - Left atrium: The atrium was normal in size. - Tricuspid valve: There was trivial regurgitation. - Pulmonary arteries: PA peak pressure: 18 mm Hg (S). - Inferior vena cava: The vessel was normal in size. The   respirophasic diameter  changes were in the normal range (= 50%),   consistent with normal central venous pressure.   Impressions: - LVEF 50-55%, mild LVH, normal wall motion, mechanical MVR -   normal leaflet motion, no obstruction, normal LA size, trivial   TR, RVSP 18 mmHg, normal IVC.  _____________     ASSESSMENT:    1. H/O mitral valve replacement with mechanical valve   2. Encounter for therapeutic drug monitoring   3. Non-ST elevation (NSTEMI) myocardial infarction (HCC)   4. Hyperlipidemia, unspecified hyperlipidemia type   5. Primary hypertension   6. TIA (transient ischemic attack)   7. Hx of medication noncompliance    PLAN:  In order of problems listed above:  NSTEMI  - thromboembolic with subtotal occlusion of the distal to mid LAD with successful balloon angioplasty but resulting in occlusion of a 1 mm distal diagonal branch.  Apparent mild single leaflet dysfunction of tilting-disc mechanical mitral valve which is likely source of thromboembolism.  He was treated with Aggrastat for about 4 hours followed by IV heparin and Coumadin.  Recommend aspirin 81 mg, Plavix 75 mg for a minimum of 1 month then 2 more months of Plavix along with warfarin, aspirin was discontinued after 1 year he remains on Plavix and warfarin but is not compliant all the time.  On most recent hospitalization his LVEF was decreased now at 45 to 50% with global hypokinesis, I will start him on Entresto 2624 p.o. twice daily, check his labs in 2 to 3 months.   Saint Jude MVR at [facility] 2016 -most recent mean transmitral gradient 5 mmHg from previous 4 mmHg.  Hyperlipidemia -on atorvastatin and Lovaza, he is not always compliant, will check lipids at the next visit.  TIA -in October 2021 secondary to medication noncompliance, he symptoms are now resolved, will continue warfarin and Plavix.  Medication noncompliance  Hypertension -I will add Entresto 26/24 mg p.o. twice daily in the settings of decreased LVEF.  Medication  Adjustments/Labs and Tests Ordered: Current medicines are reviewed at length with the patient today.  Concerns regarding medicines are outlined above.  Medication changes, Labs and Tests ordered today are listed in the Patient Instructions below. Patient Instructions  Medication Instructions:   START TAKING ENTRESTO 24-26 MG  BY MOUTH TWICE DAILY  *If you need a refill on your cardiac medications before your next appointment, please call your pharmacy*   Follow-Up:  3 MONTHS IN THE OFFICE WITH AN EXTENDER      Signed, Tobias Alexander, MD  08/03/2020 5:02 PM    [facility] [address] [place], [place], [place]  [address] Phone: [phone]; Fax: [phone]

## 2020-08-03 NOTE — Patient Instructions (Signed)
Description   Today take 3 tablets then start taking 2 tablets daily except for 3 tablets on Tuesdays and Saturdays. Recheck INR in 1 week. Coumadin Clinic [phone] Main [phone]

## 2020-08-10 ENCOUNTER — Telehealth: Payer: Self-pay | Admitting: *Deleted

## 2020-08-10 NOTE — Telephone Encounter (Signed)
Called pt since he missed appt today; left a message for him to call back directly to reschedule appt.

## 2020-08-11 ENCOUNTER — Other Ambulatory Visit: Payer: Self-pay

## 2020-08-11 ENCOUNTER — Ambulatory Visit (INDEPENDENT_AMBULATORY_CARE_PROVIDER_SITE_OTHER): Payer: 59 | Admitting: *Deleted

## 2020-08-11 DIAGNOSIS — Z952 Presence of prosthetic heart valve: Secondary | ICD-10-CM | POA: Diagnosis not present

## 2020-08-11 DIAGNOSIS — Z5181 Encounter for therapeutic drug level monitoring: Secondary | ICD-10-CM | POA: Diagnosis not present

## 2020-08-11 DIAGNOSIS — G459 Transient cerebral ischemic attack, unspecified: Secondary | ICD-10-CM | POA: Diagnosis not present

## 2020-08-11 LAB — POCT INR: INR: 3.6 — AB (ref 2.0–3.0)

## 2020-08-11 NOTE — Patient Instructions (Signed)
Description   Today take 1 tablet then continue taking 2 tablets daily except for 3 tablets on Tuesdays and Saturdays. Recheck INR in 2 weeks. Coumadin Clinic [phone] Main [phone]

## 2020-08-25 ENCOUNTER — Other Ambulatory Visit: Payer: Self-pay

## 2020-08-25 ENCOUNTER — Ambulatory Visit (INDEPENDENT_AMBULATORY_CARE_PROVIDER_SITE_OTHER): Payer: 59 | Admitting: Pharmacist

## 2020-08-25 DIAGNOSIS — Z5181 Encounter for therapeutic drug level monitoring: Secondary | ICD-10-CM

## 2020-08-25 DIAGNOSIS — Z952 Presence of prosthetic heart valve: Secondary | ICD-10-CM | POA: Diagnosis not present

## 2020-08-25 DIAGNOSIS — Z7901 Long term (current) use of anticoagulants: Secondary | ICD-10-CM

## 2020-08-25 DIAGNOSIS — G459 Transient cerebral ischemic attack, unspecified: Secondary | ICD-10-CM | POA: Diagnosis not present

## 2020-08-25 LAB — POCT INR: INR: 3.6 — AB (ref 2.0–3.0)

## 2020-08-25 NOTE — Patient Instructions (Signed)
Description   Today take 1 tablet then continue taking 2 tablets daily except for 3 tablets on Tuesdays. Recheck INR in 2 weeks. Coumadin Clinic [phone] Main [phone]

## 2020-09-05 ENCOUNTER — Other Ambulatory Visit: Payer: Self-pay | Admitting: Cardiology

## 2020-09-05 MED FILL — ENTRESTO 24 MG-26 MG TABLET: 24-26 | 30 days supply | Qty: 60 | Fill #1

## 2020-09-07 ENCOUNTER — Other Ambulatory Visit: Payer: Self-pay | Admitting: Cardiology

## 2020-09-07 MED FILL — CLOPIDOGREL 75 MG TABLET: 75 | 90 days supply | Qty: 90 | Fill #0

## 2020-09-08 ENCOUNTER — Other Ambulatory Visit: Payer: Self-pay

## 2020-09-08 ENCOUNTER — Ambulatory Visit (INDEPENDENT_AMBULATORY_CARE_PROVIDER_SITE_OTHER): Payer: 59 | Admitting: *Deleted

## 2020-09-08 ENCOUNTER — Other Ambulatory Visit: Payer: Self-pay | Admitting: Cardiology

## 2020-09-08 DIAGNOSIS — Z5181 Encounter for therapeutic drug level monitoring: Secondary | ICD-10-CM | POA: Diagnosis not present

## 2020-09-08 DIAGNOSIS — G459 Transient cerebral ischemic attack, unspecified: Secondary | ICD-10-CM | POA: Diagnosis not present

## 2020-09-08 DIAGNOSIS — Z952 Presence of prosthetic heart valve: Secondary | ICD-10-CM

## 2020-09-08 LAB — POCT INR: INR: 1.3 — AB (ref 2.0–3.0)

## 2020-09-08 MED FILL — WARFARIN SODIUM 6 MG TABLET: 6 | 30 days supply | Qty: 65 | Fill #0

## 2020-09-08 NOTE — Telephone Encounter (Signed)
Pt came to appt today. Will send in refill.

## 2020-09-08 NOTE — Patient Instructions (Addendum)
Description   Today and tomorrow take 3 tablets then continue taking 2 tablets daily except for 3 tablets on Tuesdays. Recheck INR in 1 week. Coumadin Clinic [phone] Main [phone]

## 2020-09-19 ENCOUNTER — Ambulatory Visit (INDEPENDENT_AMBULATORY_CARE_PROVIDER_SITE_OTHER): Payer: 59 | Admitting: *Deleted

## 2020-09-19 ENCOUNTER — Other Ambulatory Visit: Payer: Self-pay

## 2020-09-19 DIAGNOSIS — Z5181 Encounter for therapeutic drug level monitoring: Secondary | ICD-10-CM

## 2020-09-19 DIAGNOSIS — G459 Transient cerebral ischemic attack, unspecified: Secondary | ICD-10-CM

## 2020-09-19 DIAGNOSIS — Z952 Presence of prosthetic heart valve: Secondary | ICD-10-CM

## 2020-09-19 LAB — POCT INR: INR: 2.7 (ref 2.0–3.0)

## 2020-09-19 NOTE — Patient Instructions (Signed)
Description   Continue taking Warfarin 2 tablets daily except for 3 tablets on Tuesdays. Recheck INR in 2 weeks. Coumadin Clinic [phone] Main [phone]

## 2020-10-06 ENCOUNTER — Ambulatory Visit (INDEPENDENT_AMBULATORY_CARE_PROVIDER_SITE_OTHER): Payer: 59 | Admitting: *Deleted

## 2020-10-06 ENCOUNTER — Other Ambulatory Visit: Payer: Self-pay

## 2020-10-06 DIAGNOSIS — Z952 Presence of prosthetic heart valve: Secondary | ICD-10-CM

## 2020-10-06 DIAGNOSIS — Z5181 Encounter for therapeutic drug level monitoring: Secondary | ICD-10-CM | POA: Diagnosis not present

## 2020-10-06 DIAGNOSIS — G459 Transient cerebral ischemic attack, unspecified: Secondary | ICD-10-CM

## 2020-10-06 LAB — POCT INR: INR: 2.3 (ref 2.0–3.0)

## 2020-10-06 NOTE — Patient Instructions (Signed)
Description   Today take 3 tablets then continue taking Warfarin 2 tablets daily except for 3 tablets on Tuesdays. Recheck INR in 2 weeks. Coumadin Clinic [phone] Main [phone]

## 2020-10-18 ENCOUNTER — Other Ambulatory Visit: Payer: Self-pay | Admitting: Cardiology

## 2020-10-19 ENCOUNTER — Other Ambulatory Visit: Payer: Self-pay | Admitting: Cardiology

## 2020-10-19 MED FILL — WARFARIN SODIUM 6 MG TABLET: 6 | 30 days supply | Qty: 65 | Fill #0

## 2020-10-24 ENCOUNTER — Other Ambulatory Visit: Payer: Self-pay

## 2020-10-24 ENCOUNTER — Ambulatory Visit (INDEPENDENT_AMBULATORY_CARE_PROVIDER_SITE_OTHER): Payer: 59 | Admitting: *Deleted

## 2020-10-24 DIAGNOSIS — Z952 Presence of prosthetic heart valve: Secondary | ICD-10-CM

## 2020-10-24 DIAGNOSIS — Z5181 Encounter for therapeutic drug level monitoring: Secondary | ICD-10-CM

## 2020-10-24 LAB — POCT INR: INR: 5.5 — AB (ref 2.0–3.0)

## 2020-10-24 NOTE — Patient Instructions (Signed)
Description   Hold warfarin today and tomorrow,  then continue taking Warfarin 2 tablets daily except for 3 tablets on Tuesdays. Recheck INR in 1.5 weeks.  Coumadin Clinic [phone] Main [phone]

## 2020-10-31 NOTE — Progress Notes (Unsigned)
Cardiology Office Note    Date:  11/02/2020   ID:  Benjamin Rodriguez, DOB [birth year], MRN [id number]  PCP:  Patient, No Pcp Per  Cardiologist: Tobias Alexander, MD EPS: None  Chief Complaint  Patient presents with  . Follow-up    History of Present Illness:  Benjamin Rodriguez is a 35 y.o. 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  Suspect thromboembolic sub-total occlusion of the distal mid LAD with successful balloon angioplasty restoring TIMI-3 flow.  This resulted in occlusion of a 1 mm distal diagonal branch.  LVEF with apical anterior hypokinesis.  He was restarted on Coumadin.  2D echo showed akinesis of the apical walls overall LVEF 40 to 55% mean gradient across the mitral valve 4 mmHg.  Plan was for Plavix, aspirin and Coumadin for a minimum of 1 month and then another year of Plavix and Coumadin and after that Coumadin alone.  TIA 07/18/2020 in the setting of mechanical mitral valve and stopping Coumadin 2 weeks prior to the event.  INR was 1.0 on admission.  Echo 07/17/2020 LVEF 45 to 50% mechanical mitral valve mean gradient 5 mm essentially unchanged he saw Dr. Delton See in f/u 11/10//21 and started on entresto.   Patient comes in for f/u with his aunt on the phone. Only taking Entresto about 3 times a week. Says he's taking coumadin and metoprolol regularly. Denies chest pain palpitations, shortness of breath, edema. No regular exercise. 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   Past Medical History:  Diagnosis Date  . CAD (coronary artery disease)    a. 08/06/18- LHC- mechanical valve thromboembolic subtotal occlusion of distal-mid LAD with successful balloon angioplasty   . History of prosthetic mitral valve 2016  . Hypertension     Past Surgical History:  Procedure Laterality Date  . CARDIAC  SURGERY    . CORONARY BALLOON ANGIOPLASTY N/A 08/06/2018   Procedure: CORONARY BALLOON ANGIOPLASTY;  Surgeon: Marykay Lex, MD;  Location: [facility] INVASIVE CV LAB;  Service: Cardiovascular;  Laterality: N/A;  . LEFT HEART CATH AND CORONARY ANGIOGRAPHY N/A 08/06/2018   Procedure: LEFT HEART CATH AND CORONARY ANGIOGRAPHY;  Surgeon: Marykay Lex, MD;  Location: [facility] INVASIVE CV LAB;  Service: Cardiovascular;  Laterality: N/A;  . MITRAL VALVE REPLACEMENT      Current Medications: Current Meds  Medication Sig  . atorvastatin (LIPITOR) 80 MG tablet Take 1 tablet (80 mg total) by mouth daily. Please make overdue appt with Dr. Delton See before anymore refills. Thank you 1st attempt  . clopidogrel (PLAVIX) 75 MG tablet Take 1 tablet (75 mg total) by mouth daily.  Marland Kitchen enoxaparin (LOVENOX) 100 MG/ML injection Inject 1 mL (100 mg total) into the skin every 12 (twelve) hours for 10 days.  . metoprolol tartrate (LOPRESSOR) 25 MG tablet Take 1 tablet (25 mg total) by mouth 2 (two) times daily.  Marland Kitchen omega-3 acid ethyl esters (LOVAZA) 1 g capsule Take 1 capsule (1 g total) by mouth 2 (two) times daily.  . sacubitril-valsartan (ENTRESTO) 24-26 MG Take 1 tablet by mouth 2 (two) times daily.  Marland Kitchen warfarin (COUMADIN) 6 MG tablet TAKE 2 TABLETS BY MOUTH DAILY EXCEPT TAKE 3 TABLETS ON TUESDAYS OR AS DIRECTED BY ANTICOAGULATION CLINIC.     Allergies:   Patient has no known allergies.   Social History  Socioeconomic History  . Marital status: Single    Spouse name: Not on file  . Number of children: Not on file  . Years of education: Not on file  . Highest education level: Not on file  Occupational History  . Occupation: works at [employer] in the prior [place] department  Tobacco Use  . Smoking status: Never Smoker  . Smokeless tobacco: Never Used  Vaping Use  . Vaping Use: Never used  Substance and Sexual Activity  . Alcohol use: Yes  . Drug use: Never  . Sexual activity: Not on file  Other Topics Concern  . Not  on file  Social History Narrative  . Not on file   Social Determinants of Health   Financial Resource Strain: Not on file  Food Insecurity: Not on file  Transportation Needs: Not on file  Physical Activity: Not on file  Stress: Not on file  Social Connections: Not on file     Family History:  The patient's family history includes Aneurysm in his maternal grandmother; Breast cancer in his maternal grandmother; Heart attack in his father; Hypertension in his father.   ROS:   Please see the history of present illness.    ROS All other systems reviewed and are negative.   PHYSICAL EXAM:   VS:  BP (!) 130/92   Pulse 85   Ht 5\' 9"  (1.753 m)   Wt 228 lb (103.4 kg)   SpO2 95%   BMI 33.67 kg/m   Physical Exam  GEN: Well nourished, well developed, in no acute distress  Neck: no JVD, carotid bruits, or masses Cardiac:RRR; Crisp valves 1/6 systolic murmur LSB Respiratory:  clear to auscultation bilaterally, normal work of breathing GI: soft, nontender, nondistended, + BS Ext: without cyanosis, clubbing, or edema, Good distal pulses bilaterally Neuro:  Alert and Oriented x 3,Psych: euthymic mood, full affect  Wt Readings from Last 3 Encounters:  11/02/20 228 lb (103.4 kg)  08/03/20 222 lb (100.7 kg)  07/29/20 223 lb 14.4 oz (101.6 kg)      Studies/Labs Reviewed:   EKG:  EKG is not ordered today.    Recent Labs: 11/25/2019: TSH 3.820 07/17/2020: ALT 38; BUN 14; Creatinine, Ser 1.10; Potassium 4.2; Sodium 140 07/18/2020: Hemoglobin 15.5; Platelets 214   Lipid Panel    Component Value Date/Time   CHOL 240 (H) 07/17/2020 1143   CHOL 153 12/24/2019 0904   TRIG 117 07/17/2020 1143   HDL 42 07/17/2020 1143   HDL 39 (L) 12/24/2019 0904   CHOLHDL 5.7 07/17/2020 1143   VLDL 23 07/17/2020 1143   LDLCALC 175 (H) 07/17/2020 1143   LDLCALC 91 12/24/2019 0904    Additional studies/ records that were reviewed today include:  Echo 07/13/20 IMPRESSIONS     1. Septal  hypokinesis. Left ventricular ejection fraction, by estimation,  is 45 to 50%. The left ventricle has mildly decreased function. The left  ventricle demonstrates global hypokinesis. Left ventricular diastolic  parameters are indeterminate.   2. Right ventricular systolic function is normal. The right ventricular  size is normal. There is normal pulmonary artery systolic pressure.   3. Mechanical mitral valve. Mean gradient 5 mmHg, essentially unchanged  from 05/2019. Valve leaflets appear to be moving freely. . The mitral valve  has been repaired/replaced. No evidence of mitral valve regurgitation. No  evidence of mitral stenosis. The   mean mitral valve gradient is 5.0 mmHg. There is a St. Jude mechanical  valve present in the mitral position.  4. The aortic valve is tricuspid. Aortic valve regurgitation is not  visualized. No aortic stenosis is present.   5. The inferior vena cava is normal in size with greater than 50%  respiratory variability, suggesting right atrial pressure of 3 mmHg.  Echocardiogram 08/06/2018 Study Conclusions - Left ventricle: The cavity size was normal. Wall thickness was   increased in a pattern of mild LVH. Systolic function was normal.   The estimated ejection fraction was in the range of 50% to 55%.   The study is not technically sufficient to allow evaluation of LV   diastolic function. - Mitral valve: Mechanical MVR - normal leaflet motion. No   obstruction. Mean gradient (D): 4 mm Hg. Valve area by pressure   half-time: 2.29 cm^2. Valve area by continuity equation (using   LVOT flow): 1.12 cm^2. - Left atrium: The atrium was normal in size. - Tricuspid valve: There was trivial regurgitation. - Pulmonary arteries: PA peak pressure: 18 mm Hg (S). - Inferior vena cava: The vessel was normal in size. The   respirophasic diameter changes were in the normal range (= 50%),   consistent with normal central venous pressure.   Impressions: - LVEF 50-55%,  mild LVH, normal wall motion, mechanical MVR -   normal leaflet motion, no obstruction, normal LA size, trivial   TR, RVSP 18 mmHg, normal IVC.  _____________        Risk Assessment/Calculations:         ASSESSMENT:    1. Ischemic cardiomyopathy   2. H/O mitral valve replacement with mechanical valve   3. Coronary artery disease involving native coronary artery of native heart without angina pectoris   4. TIA (transient ischemic attack)   5. Essential hypertension   6. Hyperlipidemia, unspecified hyperlipidemia type   7. Hx of medication noncompliance      PLAN:  In order of problems listed above:  Cardiomyopathy LVEF 45 to 50% with global hypokinesis started on Entresto but not taking regularly. Stressed importance of compliance with meds. Increase entresto 49/51 mg bid, check bmet today, return in 1 month for further titration if BP tolerates  Saint Jude MVR [facility] 2016 most recent mean transmitral gradient 5 mmHg up from previous 4 mmHg trying to be compliant with coumadin  CAD status post NSTEMI thromboembolic with subtotal occlusion of the distal to mid LAD treated with successful balloon angioplasty resulting in occlusion of a 1 mm distal diagonal branch. Apparent mild single leaflet dysfunction of a tilting-disc mechanical mitral valve likely source of thromboembolus. Plavix and warfarin recommended but he is not always compliant  TIA 06/2020 secondary to stopping Coumadin  Hypertension BP up today but not taking entresto regularly  Hyperlipidemia-check lipids today  History of medical noncompliance   Shared Decision Making/Informed Consent        Medication Adjustments/Labs and Tests Ordered: Current medicines are reviewed at length with the patient today.  Concerns regarding medicines are outlined above.  Medication changes, Labs and Tests ordered today are listed in the Patient Instructions below. There are no Patient Instructions on file for this visit.    Elson Clan, PA-C  11/02/2020 10:07 AM    [facility] [address] [place], [place], [place]  [address] Phone: [phone]; Fax: [phone]

## 2020-11-02 ENCOUNTER — Other Ambulatory Visit: Payer: Self-pay | Admitting: Physician Assistant

## 2020-11-02 ENCOUNTER — Ambulatory Visit: Payer: 59 | Admitting: Physician Assistant

## 2020-11-02 ENCOUNTER — Other Ambulatory Visit: Payer: Self-pay

## 2020-11-02 ENCOUNTER — Encounter: Payer: Self-pay | Admitting: Physician Assistant

## 2020-11-02 VITALS — BP 130/92 | HR 85 | Ht 69.0 in | Wt 228.0 lb

## 2020-11-02 DIAGNOSIS — Z9114 Patient's other noncompliance with medication regimen: Secondary | ICD-10-CM | POA: Diagnosis not present

## 2020-11-02 DIAGNOSIS — Z952 Presence of prosthetic heart valve: Secondary | ICD-10-CM | POA: Diagnosis not present

## 2020-11-02 DIAGNOSIS — I2510 Atherosclerotic heart disease of native coronary artery without angina pectoris: Secondary | ICD-10-CM | POA: Diagnosis not present

## 2020-11-02 DIAGNOSIS — E785 Hyperlipidemia, unspecified: Secondary | ICD-10-CM

## 2020-11-02 DIAGNOSIS — G459 Transient cerebral ischemic attack, unspecified: Secondary | ICD-10-CM

## 2020-11-02 DIAGNOSIS — I10 Essential (primary) hypertension: Secondary | ICD-10-CM

## 2020-11-02 DIAGNOSIS — I255 Ischemic cardiomyopathy: Secondary | ICD-10-CM | POA: Diagnosis not present

## 2020-11-02 MED ORDER — ENTRESTO 49-51 MG PO TABS: 1.0000 | ORAL_TABLET | Freq: Two times a day (BID) | ORAL | 1 refills | Status: DC

## 2020-11-02 MED FILL — ENTRESTO 49 MG-51 MG TABLET: 49-51 | 30 days supply | Qty: 60 | Fill #0

## 2020-11-02 NOTE — Patient Instructions (Signed)
Medication Instructions:  Your physician has recommended you make the following change in your medication:   INCREASE: Entresto to 49-51mg  twice daily  *If you need a refill on your cardiac medications before your next appointment, please call your pharmacy*   Lab Work: BMET, FLP Today  If you have labs (blood work) drawn today and your tests are completely normal, you will receive your results only by: Marland Kitchen MyChart Message (if you have MyChart) OR . A paper copy in the mail If you have any lab test that is abnormal or we need to change your treatment, we will call you to review the results.   Follow-Up: At [facility], you and your health needs are our priority.  As part of our continuing mission to provide you with exceptional heart care, we have created designated Provider Care Teams.  These Care Teams include your primary Cardiologist (physician) and Advanced Practice Providers (APPs -  Physician Assistants and Nurse Practitioners) who all work together to provide you with the care you need, when you need it.  Your next appointment:   11/30/2020   The format for your next appointment:   In Person  Provider:   Laurance Flatten, MD

## 2020-11-03 LAB — BASIC METABOLIC PANEL
BUN/Creatinine Ratio: 12 (ref 9–20)
BUN: 13 mg/dL (ref 6–20)
CO2: 25 mmol/L (ref 20–29)
Calcium: 9.5 mg/dL (ref 8.7–10.2)
Chloride: 101 mmol/L (ref 96–106)
Creatinine, Ser: 1.08 mg/dL (ref 0.76–1.27)
GFR calc Af Amer: 102 mL/min/{1.73_m2} (ref >59)
GFR calc non Af Amer: 88 mL/min/{1.73_m2} (ref >59)
Glucose: 80 mg/dL (ref 65–99)
Potassium: 4.7 mmol/L (ref 3.5–5.2)
Sodium: 139 mmol/L (ref 134–144)

## 2020-11-03 LAB — LIPID PANEL
Chol/HDL Ratio: 6.3 ratio — ABNORMAL HIGH (ref 0.0–5.0)
Cholesterol, Total: 252 mg/dL — ABNORMAL HIGH (ref 100–199)
HDL: 40 mg/dL (ref >39)
LDL Chol Calc (NIH): 188 mg/dL — ABNORMAL HIGH (ref 0–99)
Triglycerides: 131 mg/dL (ref 0–149)
VLDL Cholesterol Cal: 24 mg/dL (ref 5–40)

## 2020-11-04 ENCOUNTER — Ambulatory Visit (INDEPENDENT_AMBULATORY_CARE_PROVIDER_SITE_OTHER): Payer: 59 | Admitting: *Deleted

## 2020-11-04 ENCOUNTER — Other Ambulatory Visit: Payer: Self-pay

## 2020-11-04 DIAGNOSIS — Z952 Presence of prosthetic heart valve: Secondary | ICD-10-CM

## 2020-11-04 DIAGNOSIS — Z5181 Encounter for therapeutic drug level monitoring: Secondary | ICD-10-CM | POA: Diagnosis not present

## 2020-11-04 LAB — POCT INR: INR: 2 (ref 2.0–3.0)

## 2020-11-04 NOTE — Patient Instructions (Signed)
Description    Take 3 tablets today and then continue to take 2 tablets daily except for 3 tablets on Tuesday and Saturday. Recheck INR in 2 weeks. [facility] [phone].

## 2020-11-16 ENCOUNTER — Other Ambulatory Visit: Payer: Self-pay

## 2020-11-16 DIAGNOSIS — I2510 Atherosclerotic heart disease of native coronary artery without angina pectoris: Secondary | ICD-10-CM

## 2020-11-16 DIAGNOSIS — E785 Hyperlipidemia, unspecified: Secondary | ICD-10-CM

## 2020-11-16 MED ORDER — ATORVASTATIN CALCIUM 80 MG PO TABS
80.0000 mg | ORAL_TABLET | Freq: Every day | ORAL | 3 refills | Status: DC
Start: 2020-11-16 — End: 2020-11-16

## 2020-11-16 MED ORDER — OMEGA-3-ACID ETHYL ESTERS 1 G PO CAPS
1.0000 g | ORAL_CAPSULE | Freq: Two times a day (BID) | ORAL | 3 refills | Status: DC
Start: 2020-11-16 — End: 2020-11-16

## 2020-11-16 MED FILL — OMEGA-3 ETHYL ESTERS 1 GM C: 1 | 90 days supply | Qty: 180 | Fill #0

## 2020-11-16 MED FILL — ATORVASTATIN 80 MG TABLET: 80 | 90 days supply | Qty: 90 | Fill #0

## 2020-11-18 ENCOUNTER — Other Ambulatory Visit: Payer: Self-pay

## 2020-11-18 ENCOUNTER — Ambulatory Visit (INDEPENDENT_AMBULATORY_CARE_PROVIDER_SITE_OTHER): Payer: 59 | Admitting: *Deleted

## 2020-11-18 DIAGNOSIS — G459 Transient cerebral ischemic attack, unspecified: Secondary | ICD-10-CM

## 2020-11-18 DIAGNOSIS — Z5181 Encounter for therapeutic drug level monitoring: Secondary | ICD-10-CM | POA: Diagnosis not present

## 2020-11-18 DIAGNOSIS — Z952 Presence of prosthetic heart valve: Secondary | ICD-10-CM | POA: Diagnosis not present

## 2020-11-18 LAB — POCT INR: INR: 2.6 (ref 2.0–3.0)

## 2020-11-18 NOTE — Patient Instructions (Signed)
Description   Continue to take 2 tablets daily except for 3 tablets on Tuesday and Saturday. Recheck INR in 3 weeks. [facility] [phone].

## 2020-11-27 NOTE — Progress Notes (Deleted)
Cardiology Office Note:    Date:  11/27/2020   ID:  Benjamin Rodriguez, DOB 1986-01-15, MRN [id number]  PCP:  Patient, No Pcp Per   [facility]  Cardiologist:  Tobias Alexander, MD *** Advanced Practice Provider:  No care team member to display Electrophysiologist:  None   Referring MD: No ref. provider found    History of Present Illness:    Vardaan Depascale is a 35 y.o. 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 Delton See who now presents to clinic for follow-up.  Patient was admitted with NSTEMI felt most probably secondary to embolization from mechanical valve thrombosis.  Suspect thromboembolic sub-total occlusion of the distal mid LAD with successful balloon angioplasty restoring TIMI-3 flow.  This resulted in occlusion of a 1 mm distal diagonal branch.  LVEF with apical anterior hypokinesis.  He was restarted on Coumadin.  TTE showed akinesis of the apical walls overall LVEF 40 to 55% mean gradient across the mitral valve 4 mmHg.  Plan was for Plavix, aspirin and Coumadin for a minimum of 1 month and then another year of Plavix and Coumadin and after that Coumadin alone.  The patient has been doing well until 07/18/2020 when he developed symptoms of transient ischemic attack with dizziness and loss of balance as well as visual changes. He admitted to not taking Coumadin 2 weeks prior to that event.  His INR on admission was 1.2.  CT showed no new changes but chronic left parietal infarct that was present in 2017.  CTA showed no occlusion of arteries.  MRI showed no acute findings.  Echo 07/17/2020 LVEF 45 to 50% mechanical mitral valve mean gradient 5 mm. He was restarted on Coumadin bridged with Lovenox, his symptoms have resolved within 24 hours.    Last saw [name] on 11/02/20. He was only taking entresto 3x/week but was compliant with  metop and warfarin  Past Medical History:  Diagnosis Date  . CAD (coronary artery disease)    a. 08/06/18- LHC- mechanical valve thromboembolic subtotal occlusion of distal-mid LAD with successful balloon angioplasty   . History of prosthetic mitral valve 2016  . Hypertension     Past Surgical History:  Procedure Laterality Date  . CARDIAC SURGERY    . CORONARY BALLOON ANGIOPLASTY N/A 08/06/2018   Procedure: CORONARY BALLOON ANGIOPLASTY;  Surgeon: Marykay Lex, MD;  Location: [facility] INVASIVE CV LAB;  Service: Cardiovascular;  Laterality: N/A;  . LEFT HEART CATH AND CORONARY ANGIOGRAPHY N/A 08/06/2018   Procedure: LEFT HEART CATH AND CORONARY ANGIOGRAPHY;  Surgeon: Marykay Lex, MD;  Location: [facility] INVASIVE CV LAB;  Service: Cardiovascular;  Laterality: N/A;  . MITRAL VALVE REPLACEMENT      Current Medications: No outpatient medications have been marked as taking for the 11/30/20 encounter (Appointment) with Meriam Sprague, MD.     Allergies:   Patient has no known allergies.   Social History   Socioeconomic History  . Marital status: Single    Spouse name: Not on file  . Number of children: Not on file  . Years of education: Not on file  . Highest education level: Not on file  Occupational History  . Occupation: works at [employer] in the prior [place] department  Tobacco Use  . Smoking status: Never Smoker  . Smokeless tobacco: Never Used  Vaping Use  . Vaping  Use: Never used  Substance and Sexual Activity  . Alcohol use: Yes  . Drug use: Never  . Sexual activity: Not on file  Other Topics Concern  . Not on file  Social History Narrative  . Not on file   Social Determinants of Health   Financial Resource Strain: Not on file  Food Insecurity: Not on file  Transportation Needs: Not on file  Physical Activity: Not on file  Stress: Not on file  Social Connections: Not on file     Family History: The patient's ***family history includes Aneurysm in his maternal  grandmother; Breast cancer in his maternal grandmother; Heart attack in his father; Hypertension in his father. There is no history of Clotting disorder or Anesthesia problems.  ROS:   Please see the history of present illness.    *** All other systems reviewed and are negative.  EKGs/Labs/Other Studies Reviewed:    The following studies were reviewed today: Echo 07/13/20 IMPRESSIONS    1. Septal hypokinesis. Left ventricular ejection fraction, by estimation,  is 45 to 50%. The left ventricle has mildly decreased function. The left  ventricle demonstrates global hypokinesis. Left ventricular diastolic  parameters are indeterminate.  2. Right ventricular systolic function is normal. The right ventricular  size is normal. There is normal pulmonary artery systolic pressure.  3. Mechanical mitral valve. Mean gradient 5 mmHg, essentially unchanged  from 05/2019. Valve leaflets appear to be moving freely. . The mitral valve  has been repaired/replaced. No evidence of mitral valve regurgitation. No  evidence of mitral stenosis. The  mean mitral valve gradient is 5.0 mmHg. There is a St. Jude mechanical  valve present in the mitral position.  4. The aortic valve is tricuspid. Aortic valve regurgitation is not  visualized. No aortic stenosis is present.  5. The inferior vena cava is normal in size with greater than 50%  respiratory variability, suggesting right atrial pressure of 3 mmHg.  Echocardiogram 08/06/2018 Study Conclusions - Left ventricle: The cavity size was normal. Wall thickness was increased in a pattern of mild LVH. Systolic function was normal. The estimated ejection fraction was in the range of 50% to 55%. The study is not technically sufficient to allow evaluation of LV diastolic function. - Mitral valve: Mechanical MVR - normal leaflet motion. No obstruction. Mean gradient (D): 4 mm Hg. Valve area by pressure half-time: 2.29 cm^2. Valve area by  continuity equation (using LVOT flow): 1.12 cm^2. - Left atrium: The atrium was normal in size. - Tricuspid valve: There was trivial regurgitation. - Pulmonary arteries: PA peak pressure: 18 mm Hg (S). - Inferior vena cava: The vessel was normal in size. The respirophasic diameter changes were in the normal range (= 50%), consistent with normal central venous pressure.  Impressions: - LVEF 50-55%, mild LVH, normal wall motion, mechanical MVR - normal leaflet motion, no obstruction, normal LA size, trivial TR, RVSP 18 mmHg, normal IVC. _____________    EKG:  EKG is *** ordered today.  The ekg ordered today demonstrates ***  Recent Labs: 07/17/2020: ALT 38 07/18/2020: Hemoglobin 15.5; Platelets 214 11/02/2020: BUN 13; Creatinine, Ser 1.08; Potassium 4.7; Sodium 139  Recent Lipid Panel    Component Value Date/Time   CHOL 252 (H) 11/02/2020 1054   TRIG 131 11/02/2020 1054   HDL 40 11/02/2020 1054   CHOLHDL 6.3 (H) 11/02/2020 1054   CHOLHDL 5.7 07/17/2020 1143   VLDL 23 07/17/2020 1143   LDLCALC 188 (H) 11/02/2020 1054     Risk Assessment/Calculations:   {  Does this patient have ATRIAL FIBRILLATION?:[phone]}   Physical Exam:    VS:  There were no vitals taken for this visit.    Wt Readings from Last 3 Encounters:  11/02/20 228 lb (103.4 kg)  08/03/20 222 lb (100.7 kg)  07/29/20 223 lb 14.4 oz (101.6 kg)     GEN: *** Well nourished, well developed in no acute distress HEENT: Normal NECK: No JVD; No carotid bruits LYMPHATICS: No lymphadenopathy CARDIAC: ***RRR, no murmurs, rubs, gallops RESPIRATORY:  Clear to auscultation without rales, wheezing or rhonchi  ABDOMEN: Soft, non-tender, non-distended MUSCULOSKELETAL:  No edema; No deformity  SKIN: Warm and dry NEUROLOGIC:  Alert and oriented x 3 PSYCHIATRIC:  Normal affect   ASSESSMENT:    No diagnosis found. PLAN:    In order of problems listed above:  #Chronic Heart Failure with Reduced  EF: LVEF 45 to 50% with global hypokinesis on TTE in 06/2020. Only intermittently compliant with medications. -Continue entresto 49/51 BID  #NSTEMI : Patient with NSTEMI in 2019. Thromboembolic with cath demonstrating subtotal occlusion of the distal to mid LAD with successful balloon angioplasty but resulting in occlusion of a 1 mm distal diagonal branch.  Apparent mild single leaflet dysfunction of tilting-disc mechanical mitral valve which is likely source of thromboembolism.  He was treated with Aggrastat for about 4 hours followed by IV heparin and Coumadin.  Recommend aspirin 81 mg, Plavix 75 mg for a minimum of 1 month then 2 more months of Plavix along with warfarin, aspirin was discontinued after 1 year. Now on Plavix and warfarin but is not compliant all the time. TTE10/24/21  LVEF 45 to 50% with global hypokinesis. -Continue plavix and warfarin -Emphasize importance of compliance  #Saint Jude MVR at [facility] 2016: Most recent mean transmitral gradient 5 mmHg from previous 4 mmHg.  #Hyperlipidemia: -Continue atorvastatin and Lovaza -Check lipids  #TIA: Occurred in October 2021 secondary to medication noncompliance, he symptoms are now resolved. -Continue warfarin and Plavix   {Are you ordering a CV Procedure (e.g. stress test, cath, DCCV, TEE, etc)?   Press F2        :[id number]}    Medication Adjustments/Labs and Tests Ordered: Current medicines are reviewed at length with the patient today.  Concerns regarding medicines are outlined above.  No orders of the defined types were placed in this encounter.  No orders of the defined types were placed in this encounter.   There are no Patient Instructions on file for this visit.   Signed, Meriam Sprague, MD  11/27/2020 7:45 PM    [facility]

## 2020-11-30 ENCOUNTER — Ambulatory Visit: Payer: 59 | Admitting: Cardiology

## 2020-12-01 DIAGNOSIS — Z20822 Contact with and (suspected) exposure to covid-19: Secondary | ICD-10-CM | POA: Diagnosis not present

## 2020-12-01 DIAGNOSIS — Z03818 Encounter for observation for suspected exposure to other biological agents ruled out: Secondary | ICD-10-CM | POA: Diagnosis not present

## 2020-12-02 DIAGNOSIS — B349 Viral infection, unspecified: Secondary | ICD-10-CM | POA: Diagnosis not present

## 2020-12-03 DIAGNOSIS — B349 Viral infection, unspecified: Secondary | ICD-10-CM | POA: Diagnosis not present

## 2020-12-04 DIAGNOSIS — R197 Diarrhea, unspecified: Secondary | ICD-10-CM | POA: Diagnosis not present

## 2020-12-04 DIAGNOSIS — R112 Nausea with vomiting, unspecified: Secondary | ICD-10-CM | POA: Diagnosis not present

## 2020-12-04 DIAGNOSIS — R109 Unspecified abdominal pain: Secondary | ICD-10-CM | POA: Diagnosis not present

## 2020-12-04 DIAGNOSIS — R590 Localized enlarged lymph nodes: Secondary | ICD-10-CM | POA: Diagnosis not present

## 2020-12-04 DIAGNOSIS — B349 Viral infection, unspecified: Secondary | ICD-10-CM | POA: Diagnosis not present

## 2020-12-04 DIAGNOSIS — K529 Noninfective gastroenteritis and colitis, unspecified: Secondary | ICD-10-CM | POA: Diagnosis not present

## 2020-12-04 DIAGNOSIS — Z20822 Contact with and (suspected) exposure to covid-19: Secondary | ICD-10-CM | POA: Diagnosis not present

## 2020-12-04 DIAGNOSIS — Z79899 Other long term (current) drug therapy: Secondary | ICD-10-CM | POA: Diagnosis not present

## 2020-12-11 NOTE — Progress Notes (Deleted)
Cardiology Office Note:    Date:  12/11/2020   ID:  Benjamin Rodriguez, DOB Jul 27, 1986, MRN [id number]  PCP:  Patient, No Pcp Per    [facility]  Cardiologist:  Tobias Alexander, MD  Advanced Practice Provider:  No care team member to display Electrophysiologist:  None    Referring MD: No ref. provider found     History of Present Illness:    Benjamin Rodriguez is a 35 y.o. 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 Delton Rodriguez who now presents to clinic for follow-up.  Patient was admitted withNSTEMI felt most probably secondary to embolization from mechanical valve thrombosis. Suspect thromboembolic sub-total occlusion of the distal mid LAD with successful balloon angioplasty restoring TIMI-3 flow. This resulted in occlusion of a 1 mm distal diagonal branch. LVEF with apical anterior hypokinesis. He was restarted on Coumadin. TTE showed akinesis of the apical walls overall LVEF 40 to 55% mean gradient across the mitral valve 4 mmHg. Plan was for Plavix, aspirin and Coumadin for a minimum of 1 month and then another year of Plavix and Coumadin and after that Coumadin alone.  The patient has been doing well until 07/18/2020 when he developed symptoms of transient ischemic attack with dizziness and loss of balance as well as visual changes. He admitted to not taking Coumadin 2 weeks prior to that event. His INR on admission was 1.2. CT showed no new changes but chronic left parietal infarct that was present in 2017. CTA showed no occlusion of arteries. MRI showed no acute findings. Echo 07/17/2020 LVEF 45 to 50% mechanical mitral valve mean gradient 5 mm. He was restarted on Coumadin bridged with Lovenox, his symptoms have resolved within 24 hours.   Last saw Benjamin Rodriguez on 11/02/20. He was only taking entresto 3x/week but was compliant  with metop and warfarin   Past Medical History:  Diagnosis Date  . CAD (coronary artery disease)    a. 08/06/18- LHC- mechanical valve thromboembolic subtotal occlusion of distal-mid LAD with successful balloon angioplasty   . History of prosthetic mitral valve 2016  . Hypertension     Past Surgical History:  Procedure Laterality Date  . CARDIAC SURGERY    . CORONARY BALLOON ANGIOPLASTY N/A 08/06/2018   Procedure: CORONARY BALLOON ANGIOPLASTY;  Surgeon: Marykay Lex, MD;  Location: [facility] INVASIVE CV LAB;  Service: Cardiovascular;  Laterality: N/A;  . LEFT HEART CATH AND CORONARY ANGIOGRAPHY N/A 08/06/2018   Procedure: LEFT HEART CATH AND CORONARY ANGIOGRAPHY;  Surgeon: Marykay Lex, MD;  Location: [facility] INVASIVE CV LAB;  Service: Cardiovascular;  Laterality: N/A;  . MITRAL VALVE REPLACEMENT      Current Medications: No outpatient medications have been marked as taking for the 12/13/20 encounter (Appointment) with Meriam Sprague, MD.     Allergies:   Patient has no known allergies.   Social History   Socioeconomic History  . Marital status: Single    Spouse name: Not on file  . Number of children: Not on file  . Years of education: Not on file  . Highest education level: Not on file  Occupational History  . Occupation: works at [employer] in the prior [place] department  Tobacco Use  . Smoking status: Never Smoker  . Smokeless tobacco: Never Used  Vaping Use  . Vaping Use: Never used  Substance and Sexual Activity  .  Alcohol use: Yes  . Drug use: Never  . Sexual activity: Not on file  Other Topics Concern  . Not on file  Social History Narrative  . Not on file   Social Determinants of Health   Financial Resource Strain: Not on file  Food Insecurity: Not on file  Transportation Needs: Not on file  Physical Activity: Not on file  Stress: Not on file  Social Connections: Not on file     Family History: The patient's ***family history includes Aneurysm in his  maternal grandmother; Breast cancer in his maternal grandmother; Heart attack in his father; Hypertension in his father. There is no history of Clotting disorder or Anesthesia problems.  ROS:   Please Rodriguez the history of present illness.    *** All other systems reviewed and are negative.  EKGs/Labs/Other Studies Reviewed:    The following studies were reviewed today: Echo 07/13/20 IMPRESSIONS    1. Septal hypokinesis. Left ventricular ejection fraction, by estimation,  is 45 to 50%. The left ventricle has mildly decreased function. The left  ventricle demonstrates global hypokinesis. Left ventricular diastolic  parameters are indeterminate.  2. Right ventricular systolic function is normal. The right ventricular  size is normal. There is normal pulmonary artery systolic pressure.  3. Mechanical mitral valve. Mean gradient 5 mmHg, essentially unchanged  from 05/2019. Valve leaflets appear to be moving freely. . The mitral valve  has been repaired/replaced. No evidence of mitral valve regurgitation. No  evidence of mitral stenosis. The  mean mitral valve gradient is 5.0 mmHg. There is a St. Jude mechanical  valve present in the mitral position.  4. The aortic valve is tricuspid. Aortic valve regurgitation is not  visualized. No aortic stenosis is present.  5. The inferior vena cava is normal in size with greater than 50%  respiratory variability, suggesting right atrial pressure of 3 mmHg.  Echocardiogram 08/06/2018 Study Conclusions - Left ventricle: The cavity size was normal. Wall thickness was increased in a pattern of mild LVH. Systolic function was normal. The estimated ejection fraction was in the range of 50% to 55%. The study is not technically sufficient to allow evaluation of LV diastolic function. - Mitral valve: Mechanical MVR - normal leaflet motion. No obstruction. Mean gradient (D): 4 mm Hg. Valve area by pressure half-time: 2.29 cm^2. Valve area  by continuity equation (using LVOT flow): 1.12 cm^2. - Left atrium: The atrium was normal in size. - Tricuspid valve: There was trivial regurgitation. - Pulmonary arteries: PA peak pressure: 18 mm Hg (S). - Inferior vena cava: The vessel was normal in size. The respirophasic diameter changes were in the normal range (= 50%), consistent with normal central venous pressure.  Impressions: - LVEF 50-55%, mild LVH, normal wall motion, mechanical MVR - normal leaflet motion, no obstruction, normal LA size, trivial TR, RVSP 18 mmHg, normal IVC. _____________  EKG:  EKG is *** ordered today.  The ekg ordered today demonstrates ***  Recent Labs: 07/17/2020: ALT 38 07/18/2020: Hemoglobin 15.5; Platelets 214 11/02/2020: BUN 13; Creatinine, Ser 1.08; Potassium 4.7; Sodium 139  Recent Lipid Panel    Component Value Date/Time   CHOL 252 (H) 11/02/2020 1054   TRIG 131 11/02/2020 1054   HDL 40 11/02/2020 1054   CHOLHDL 6.3 (H) 11/02/2020 1054   CHOLHDL 5.7 07/17/2020 1143   VLDL 23 07/17/2020 1143   LDLCALC 188 (H) 11/02/2020 1054     Risk Assessment/Calculations:   {Does this patient have ATRIAL FIBRILLATION?[phone]}   Physical Exam:  VS:  There were no vitals taken for this visit.    Wt Readings from Last 3 Encounters:  11/02/20 228 lb (103.4 kg)  08/03/20 222 lb (100.7 kg)  07/29/20 223 lb 14.4 oz (101.6 kg)     GEN: *** Well nourished, well developed in no acute distress HEENT: Normal NECK: No JVD; No carotid bruits LYMPHATICS: No lymphadenopathy CARDIAC: ***RRR, no murmurs, rubs, gallops RESPIRATORY:  Clear to auscultation without rales, wheezing or rhonchi  ABDOMEN: Soft, non-tender, non-distended MUSCULOSKELETAL:  No edema; No deformity  SKIN: Warm and dry NEUROLOGIC:  Alert and oriented x 3 PSYCHIATRIC:  Normal affect   ASSESSMENT:    No diagnosis found. PLAN:    In order of problems listed above:  #Chronic Heart Failure with Reduced  EF: LVEF 45 to 50% with global hypokinesis on TTE in 06/2020. Only intermittently compliant with medications. -Continue entresto 49/51 BID  #NSTEMI: Patient with NSTEMI in 2019. Thromboembolic with cath demonstrating subtotal occlusion of the distal to mid LAD with successful balloon angioplasty but resulting in occlusion of a 1 mm distal diagonal branch. Apparent mild single leaflet dysfunction of tilting-disc mechanical mitral valve which is likely source of thromboembolism. He was treated with Aggrastat for about 4 hours followed by IV heparin and Coumadin. Recommend aspirin 81 mg, Plavix 75 mg for a minimum of 1 month then 2 more months of Plavix along with warfarin,aspirin was discontinued after 1 year. Now on Plavix and warfarin but is not compliant all the time. TTE10/24/21  LVEF 45 to 50% with global hypokinesis. -Continue plavix and warfarin -Emphasize importance of compliance  #Saint Jude MVR at [facility] 2016: Most recent mean transmitral gradient 5 mmHg from previous 4 mmHg.  #Hyperlipidemia: -Continue atorvastatin and Lovaza -Check lipids  #TIA: Occurred in October 2021 secondary to medication noncompliance, he symptoms are now resolved. -Continue warfarin and Plavix   {Are you ordering a CV Procedure (e.g. stress test, cath, DCCV, TEE, etc)?   Press F2        :[id number]}    Medication Adjustments/Labs and Tests Ordered: Current medicines are reviewed at length with the patient today.  Concerns regarding medicines are outlined above.  No orders of the defined types were placed in this encounter.  No orders of the defined types were placed in this encounter.   There are no Patient Instructions on file for this visit.   Signed, Meriam Sprague, MD  12/11/2020 12:53 PM    [facility]

## 2020-12-13 ENCOUNTER — Ambulatory Visit: Payer: 59 | Admitting: Cardiology

## 2020-12-21 DIAGNOSIS — Z20822 Contact with and (suspected) exposure to covid-19: Secondary | ICD-10-CM | POA: Diagnosis not present

## 2020-12-22 ENCOUNTER — Telehealth: Payer: Self-pay | Admitting: *Deleted

## 2020-12-22 NOTE — Telephone Encounter (Signed)
Pt is overdue to have INR checked. Attempted to call pt, no answer- unable to leave message because voice mailbox is not set up.

## 2021-01-03 ENCOUNTER — Other Ambulatory Visit: Payer: Self-pay | Admitting: Cardiology

## 2021-01-04 NOTE — Telephone Encounter (Signed)
Pt is overdue for INR. He will come in Friday AM. Has enough warfarin to get to apt

## 2021-01-04 NOTE — Telephone Encounter (Signed)
Patient is overdue for INR check

## 2021-01-06 ENCOUNTER — Ambulatory Visit (INDEPENDENT_AMBULATORY_CARE_PROVIDER_SITE_OTHER): Payer: 59

## 2021-01-06 ENCOUNTER — Other Ambulatory Visit: Payer: Self-pay

## 2021-01-06 ENCOUNTER — Other Ambulatory Visit (HOSPITAL_COMMUNITY): Payer: Self-pay

## 2021-01-06 DIAGNOSIS — Z952 Presence of prosthetic heart valve: Secondary | ICD-10-CM

## 2021-01-06 DIAGNOSIS — G459 Transient cerebral ischemic attack, unspecified: Secondary | ICD-10-CM

## 2021-01-06 DIAGNOSIS — Z5181 Encounter for therapeutic drug level monitoring: Secondary | ICD-10-CM | POA: Diagnosis not present

## 2021-01-06 LAB — POCT INR: INR: 2.9 (ref 2.0–3.0)

## 2021-01-06 MED ORDER — WARFARIN SODIUM 6 MG PO TABS
ORAL_TABLET | ORAL | 1 refills | Status: DC
  Filled 2021-01-06: qty 65, 21d supply, fill #0
  Filled 2021-01-20: qty 65, 23d supply, fill #0
  Filled 2021-03-17: qty 65, 23d supply, fill #1

## 2021-01-06 NOTE — Patient Instructions (Signed)
Description   Continue to take 2 tablets daily except for 3 tablets on Tuesdays and Saturdays. Recheck INR in 4 weeks. [facility] [phone].

## 2021-01-16 ENCOUNTER — Other Ambulatory Visit (HOSPITAL_COMMUNITY): Payer: Self-pay

## 2021-01-20 ENCOUNTER — Other Ambulatory Visit (HOSPITAL_COMMUNITY): Payer: Self-pay

## 2021-02-08 ENCOUNTER — Telehealth: Payer: Self-pay | Admitting: *Deleted

## 2021-02-08 NOTE — Telephone Encounter (Signed)
Called pt since he is overdue for his Anticoagulation Appt; left a message for the pt to call back to get rescheduled. Will await a call back.

## 2021-02-14 ENCOUNTER — Other Ambulatory Visit: Payer: 59

## 2021-03-02 ENCOUNTER — Telehealth: Payer: Self-pay | Admitting: *Deleted

## 2021-03-02 NOTE — Telephone Encounter (Signed)
Spoke with pt since he is overdue for his Anticoagulation Appt; he is aware of the risks of not having INR managed and recalls the TIA he had in the past. Also, pt has a new job and schedule is different. Set an appt for the pt on 03/07/21.

## 2021-03-07 ENCOUNTER — Ambulatory Visit (INDEPENDENT_AMBULATORY_CARE_PROVIDER_SITE_OTHER): Payer: 59

## 2021-03-07 ENCOUNTER — Other Ambulatory Visit: Payer: Self-pay

## 2021-03-07 DIAGNOSIS — Z5181 Encounter for therapeutic drug level monitoring: Secondary | ICD-10-CM | POA: Diagnosis not present

## 2021-03-07 DIAGNOSIS — Z952 Presence of prosthetic heart valve: Secondary | ICD-10-CM

## 2021-03-07 DIAGNOSIS — G459 Transient cerebral ischemic attack, unspecified: Secondary | ICD-10-CM | POA: Diagnosis not present

## 2021-03-07 LAB — POCT INR: INR: 1.8 — AB (ref 2.0–3.0)

## 2021-03-07 NOTE — Patient Instructions (Signed)
Description   Take 3 tablets today and tomorrow, then resume same dosage 2 tablets daily except for 3 tablets on Tuesdays and Saturdays. Recheck INR in 2 weeks. [facility] [phone].

## 2021-03-17 ENCOUNTER — Other Ambulatory Visit (HOSPITAL_COMMUNITY): Payer: Self-pay

## 2021-03-21 ENCOUNTER — Telehealth: Payer: Self-pay | Admitting: *Deleted

## 2021-03-21 NOTE — Telephone Encounter (Signed)
Called pt since he missed today's Anticoagulation appt; left a message for the pt to call back to reschedule appt.

## 2021-04-13 ENCOUNTER — Telehealth: Payer: Self-pay | Admitting: *Deleted

## 2021-04-13 NOTE — Telephone Encounter (Signed)
Called the pt since he overdue for an Anticoagulation/INR Appt; left a message for the pt to call back to reschedule.

## 2021-05-04 ENCOUNTER — Other Ambulatory Visit: Payer: Self-pay | Admitting: Cardiology

## 2021-05-04 ENCOUNTER — Other Ambulatory Visit (HOSPITAL_COMMUNITY): Payer: Self-pay

## 2021-05-08 ENCOUNTER — Other Ambulatory Visit (HOSPITAL_COMMUNITY): Payer: Self-pay

## 2021-05-09 ENCOUNTER — Other Ambulatory Visit (HOSPITAL_COMMUNITY): Payer: Self-pay

## 2021-05-09 ENCOUNTER — Ambulatory Visit (INDEPENDENT_AMBULATORY_CARE_PROVIDER_SITE_OTHER): Payer: 59

## 2021-05-09 ENCOUNTER — Other Ambulatory Visit: Payer: Self-pay

## 2021-05-09 DIAGNOSIS — G459 Transient cerebral ischemic attack, unspecified: Secondary | ICD-10-CM | POA: Diagnosis not present

## 2021-05-09 DIAGNOSIS — Z5181 Encounter for therapeutic drug level monitoring: Secondary | ICD-10-CM

## 2021-05-09 DIAGNOSIS — Z952 Presence of prosthetic heart valve: Secondary | ICD-10-CM

## 2021-05-09 LAB — POCT INR: INR: 1.3 — AB (ref 2.0–3.0)

## 2021-05-09 MED ORDER — WARFARIN SODIUM 6 MG PO TABS
ORAL_TABLET | ORAL | 0 refills | Status: DC
  Filled 2021-05-09: qty 65, 30d supply, fill #0

## 2021-05-09 NOTE — Telephone Encounter (Signed)
Pt is overdue for Anticoagulation Check; multiple calls/messages left for the pt. Called pt & he would be in traffic court today and should be out by 2p,. Made abn appt for 230pm today. Pt states he has been taking meds up ibntil this weekend. Will evaluate refills at visit.

## 2021-05-09 NOTE — Patient Instructions (Signed)
Description   Take 3 tablets for the next 3 days, then resume same dosage 2 tablets daily except for 3 tablets on Tuesdays and Saturdays. Recheck INR in 1 weeks. [facility] [phone].

## 2021-05-12 ENCOUNTER — Other Ambulatory Visit (HOSPITAL_COMMUNITY): Payer: Self-pay

## 2021-05-16 ENCOUNTER — Ambulatory Visit (INDEPENDENT_AMBULATORY_CARE_PROVIDER_SITE_OTHER): Payer: 59 | Admitting: *Deleted

## 2021-05-16 ENCOUNTER — Other Ambulatory Visit (HOSPITAL_COMMUNITY): Payer: Self-pay

## 2021-05-16 ENCOUNTER — Other Ambulatory Visit: Payer: Self-pay | Admitting: Cardiology

## 2021-05-16 ENCOUNTER — Other Ambulatory Visit: Payer: Self-pay

## 2021-05-16 DIAGNOSIS — G459 Transient cerebral ischemic attack, unspecified: Secondary | ICD-10-CM | POA: Diagnosis not present

## 2021-05-16 DIAGNOSIS — Z952 Presence of prosthetic heart valve: Secondary | ICD-10-CM

## 2021-05-16 DIAGNOSIS — Z5181 Encounter for therapeutic drug level monitoring: Secondary | ICD-10-CM | POA: Diagnosis not present

## 2021-05-16 LAB — POCT INR: INR: 1.4 — AB (ref 2.0–3.0)

## 2021-05-16 MED ORDER — WARFARIN SODIUM 6 MG PO TABS
ORAL_TABLET | ORAL | 0 refills | Status: DC
  Filled 2021-05-16: qty 65, 30d supply, fill #0

## 2021-05-16 MED FILL — Sacubitril-Valsartan Tab 49-51 MG: ORAL | 30 days supply | Qty: 60 | Fill #0 | Status: CN

## 2021-05-16 NOTE — Telephone Encounter (Addendum)
Appt today; will evaluate for refill at appt  At 421pm, pt came to appt states he lost his meds while out of town so has not had meds since Sunday.

## 2021-05-16 NOTE — Patient Instructions (Signed)
Description   Take 4 tablets today and 3 tablets Wednesday and Thursday then resume taking 2 tablets daily except for 3 tablets on Tuesdays and Saturdays. Recheck INR in 1 week. [facility] [phone].

## 2021-05-18 ENCOUNTER — Other Ambulatory Visit (HOSPITAL_COMMUNITY): Payer: Self-pay

## 2021-05-22 ENCOUNTER — Other Ambulatory Visit (HOSPITAL_COMMUNITY): Payer: Self-pay

## 2021-05-24 ENCOUNTER — Other Ambulatory Visit (HOSPITAL_COMMUNITY): Payer: Self-pay

## 2021-05-24 MED FILL — Sacubitril-Valsartan Tab 49-51 MG: ORAL | 30 days supply | Qty: 60 | Fill #0 | Status: CN

## 2021-05-25 ENCOUNTER — Encounter: Payer: Self-pay | Admitting: *Deleted

## 2021-05-25 ENCOUNTER — Other Ambulatory Visit (HOSPITAL_COMMUNITY): Payer: Self-pay

## 2021-05-25 ENCOUNTER — Other Ambulatory Visit: Payer: Self-pay

## 2021-05-25 ENCOUNTER — Ambulatory Visit (INDEPENDENT_AMBULATORY_CARE_PROVIDER_SITE_OTHER): Payer: 59 | Admitting: *Deleted

## 2021-05-25 DIAGNOSIS — Z952 Presence of prosthetic heart valve: Secondary | ICD-10-CM

## 2021-05-25 DIAGNOSIS — Z5181 Encounter for therapeutic drug level monitoring: Secondary | ICD-10-CM

## 2021-05-25 DIAGNOSIS — G459 Transient cerebral ischemic attack, unspecified: Secondary | ICD-10-CM | POA: Diagnosis not present

## 2021-05-25 LAB — POCT INR: INR: 3.6 — AB (ref 2.0–3.0)

## 2021-05-25 NOTE — Patient Instructions (Signed)
Description   Today take 1 tablet then continue taking 2 tablets daily except for 3 tablets on Tuesdays and Saturdays. Recheck INR in 3 weeks. [facility] [phone].

## 2021-06-05 MED FILL — Sacubitril-Valsartan Tab 49-51 MG: ORAL | 30 days supply | Qty: 60 | Fill #0 | Status: CN

## 2021-06-06 ENCOUNTER — Other Ambulatory Visit (HOSPITAL_COMMUNITY): Payer: Self-pay

## 2021-06-09 ENCOUNTER — Other Ambulatory Visit (HOSPITAL_COMMUNITY): Payer: Self-pay

## 2021-06-15 ENCOUNTER — Ambulatory Visit (INDEPENDENT_AMBULATORY_CARE_PROVIDER_SITE_OTHER): Payer: 59

## 2021-06-15 ENCOUNTER — Other Ambulatory Visit: Payer: Self-pay

## 2021-06-15 DIAGNOSIS — Z952 Presence of prosthetic heart valve: Secondary | ICD-10-CM

## 2021-06-15 DIAGNOSIS — Z5181 Encounter for therapeutic drug level monitoring: Secondary | ICD-10-CM | POA: Diagnosis not present

## 2021-06-15 LAB — POCT INR: INR: 3.9 — AB (ref 2.0–3.0)

## 2021-06-15 NOTE — Patient Instructions (Signed)
Description   Today take 1 tablet then continue taking 2 tablets daily except for 3 tablets on Tuesdays and Saturdays. Recheck INR in 3 weeks. [facility] [phone].      

## 2021-06-26 ENCOUNTER — Other Ambulatory Visit (HOSPITAL_COMMUNITY): Payer: Self-pay

## 2021-06-26 ENCOUNTER — Other Ambulatory Visit: Payer: Self-pay | Admitting: Cardiology

## 2021-06-26 MED FILL — Sacubitril-Valsartan Tab 49-51 MG: ORAL | 30 days supply | Qty: 60 | Fill #0 | Status: CN

## 2021-06-26 MED FILL — Clopidogrel Bisulfate Tab 75 MG (Base Equiv): ORAL | 30 days supply | Qty: 30 | Fill #0 | Status: AC

## 2021-06-27 ENCOUNTER — Other Ambulatory Visit (HOSPITAL_COMMUNITY): Payer: Self-pay

## 2021-06-27 MED ORDER — WARFARIN SODIUM 6 MG PO TABS
ORAL_TABLET | ORAL | 0 refills | Status: DC
  Filled 2021-06-27: qty 65, 30d supply, fill #0

## 2021-07-07 ENCOUNTER — Ambulatory Visit (HOSPITAL_BASED_OUTPATIENT_CLINIC_OR_DEPARTMENT_OTHER): Payer: 59 | Admitting: Family

## 2021-07-07 ENCOUNTER — Other Ambulatory Visit (HOSPITAL_COMMUNITY): Payer: Self-pay

## 2021-07-07 MED FILL — Sacubitril-Valsartan Tab 49-51 MG: ORAL | 30 days supply | Qty: 60 | Fill #0 | Status: CN

## 2021-07-11 ENCOUNTER — Encounter (HOSPITAL_BASED_OUTPATIENT_CLINIC_OR_DEPARTMENT_OTHER): Payer: Self-pay | Admitting: Family

## 2021-07-11 ENCOUNTER — Other Ambulatory Visit (HOSPITAL_COMMUNITY): Payer: Self-pay

## 2021-07-11 ENCOUNTER — Other Ambulatory Visit: Payer: Self-pay

## 2021-07-11 ENCOUNTER — Ambulatory Visit (INDEPENDENT_AMBULATORY_CARE_PROVIDER_SITE_OTHER): Payer: 59 | Admitting: Family

## 2021-07-11 ENCOUNTER — Ambulatory Visit (INDEPENDENT_AMBULATORY_CARE_PROVIDER_SITE_OTHER): Payer: 59 | Admitting: *Deleted

## 2021-07-11 VITALS — BP 138/100 | HR 72 | Ht 70.0 in | Wt 235.0 lb

## 2021-07-11 DIAGNOSIS — I10 Essential (primary) hypertension: Secondary | ICD-10-CM

## 2021-07-11 DIAGNOSIS — E785 Hyperlipidemia, unspecified: Secondary | ICD-10-CM

## 2021-07-11 DIAGNOSIS — I25118 Atherosclerotic heart disease of native coronary artery with other forms of angina pectoris: Secondary | ICD-10-CM

## 2021-07-11 DIAGNOSIS — G459 Transient cerebral ischemic attack, unspecified: Secondary | ICD-10-CM

## 2021-07-11 DIAGNOSIS — Z952 Presence of prosthetic heart valve: Secondary | ICD-10-CM

## 2021-07-11 DIAGNOSIS — Z5181 Encounter for therapeutic drug level monitoring: Secondary | ICD-10-CM | POA: Diagnosis not present

## 2021-07-11 DIAGNOSIS — Z7901 Long term (current) use of anticoagulants: Secondary | ICD-10-CM

## 2021-07-11 LAB — POCT INR: INR: 2.7 (ref 2.0–3.0)

## 2021-07-11 MED ORDER — LOSARTAN POTASSIUM 50 MG PO TABS
50.0000 mg | ORAL_TABLET | Freq: Every day | ORAL | 3 refills | Status: DC
  Filled 2021-07-11 – 2021-08-02 (×2): qty 30, 30d supply, fill #0

## 2021-07-11 NOTE — Progress Notes (Signed)
Office Visit    Patient Name: Benjamin Rodriguez Date of Encounter: 07/11/2021  PCP:  Patient, No Pcp Per (Inactive)   [facility]  Cardiologist:  Meriam Sprague, MD  Advanced Practice Provider:  No care team member to display Electrophysiologist:  None      Chief Complaint    Benjamin Rodriguez is a 35 y.o. male with a hx of MR s/p St. Jude MVR at [facility] 2016, TIA, HFrEF, CAD s/p NSTEMI thromboembolic with subtotal occlusion of distal to mid LAD with balloon angioplasty, HTN, HLD presents today for overdue cardiology follow up   Past Medical History    Past Medical History:  Diagnosis Date   CAD (coronary artery disease)    a. 08/06/18- LHC- mechanical valve thromboembolic subtotal occlusion of distal-mid LAD with successful balloon angioplasty    History of prosthetic mitral valve 2016   Hypertension    Past Surgical History:  Procedure Laterality Date   CARDIAC SURGERY     CORONARY BALLOON ANGIOPLASTY N/A 08/06/2018   Procedure: CORONARY BALLOON ANGIOPLASTY;  Surgeon: Marykay Lex, MD;  Location: [facility] INVASIVE CV LAB;  Service: Cardiovascular;  Laterality: N/A;   LEFT HEART CATH AND CORONARY ANGIOGRAPHY N/A 08/06/2018   Procedure: LEFT HEART CATH AND CORONARY ANGIOGRAPHY;  Surgeon: Marykay Lex, MD;  Location: [facility] INVASIVE CV LAB;  Service: Cardiovascular;  Laterality: N/A;   MITRAL VALVE REPLACEMENT      Allergies  No Known Allergies  History of Present Illness    Benjamin Rodriguez is a 35 y.o. male with a hx of MR s/p St. Jude MVR at [facility] 2016, TIA, HFrEF, CAD s/p NSTEMI thromboembolic with subtotal occlusion of distal to mid LAD with balloon angioplasty, HTN, HLD last seen 10/2020 by Herma Carson, PA.  Admitted with STEMI 2019 felt to be secondary to embolization from mechanical valve thrombus. Suspected thromboembolic sub total occlusion of distal mid LAD with successful balloon angioplasty restoring TIMI-3 flow. Resulted in occlusion of 1 mm  distal diagonal branch. LVEF with apical anterior hypokinesis. Coumadin was resumed as he had not been taking.   He had TIA 07/18/20 in setting of mechanical mitral valve and stopping Coumadin 2 weeks prior to event. INR was 1.0 on admit. Echo 07/17/21 LVEF 45-50%, mechanical mitral valve mean gradient 5 mm essentially unchanged from prior 08/03/20. He was started on Entresto.   He was seen 10/2020 by Herma Carson, PA noted to be taking Entresto only 3 times per week.   Presents today for overdue cardiology follow up. Following with Coumadin Clinic with labile INR. 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 No formal exercise routine. Recognizes need to be active. Eats often out or easy meals. Not much time to cook with working two jobs. Reports no shortness of breath nor dyspnea on exertion. Reports no chest pain, pressure, or tightness. No edema, orthopnea, PND. Reports no palpitations. Last took [place] about 3-4 months ago.   EKGs/Labs/Other Studies Reviewed:   The following studies were reviewed today:  Echo 07/17/20  1. Septal hypokinesis. Left ventricular ejection fraction, by estimation,  is 45 to 50%. The left ventricle has mildly decreased function. The left  ventricle demonstrates global hypokinesis. Left ventricular diastolic  parameters are indeterminate.   2. Right ventricular systolic function is normal. The right ventricular  size is normal. There is normal pulmonary artery systolic pressure.   3. Mechanical mitral valve. Mean gradient 5 mmHg, essentially unchanged  from 05/2019. Valve leaflets appear to be moving freely. . The mitral valve  has been repaired/replaced. No evidence of mitral valve regurgitation. No  evidence of mitral stenosis. The   mean mitral valve gradient is 5.0 mmHg. There is a St. Jude mechanical  valve present in the mitral position.   4. The aortic valve is tricuspid. Aortic valve regurgitation is not  visualized. No aortic  stenosis is present.   5. The inferior vena cava is normal in size with greater than 50%  respiratory variability, suggesting right atrial pressure of 3 mmHg.   EKG:  EKG is  ordered today.  The ekg ordered today demonstrates NSR 72 bpm right right superior axxis deviation.   Recent Labs: 07/17/2020: ALT 38 07/18/2020: Hemoglobin 15.5; Platelets 214 11/02/2020: BUN 13; Creatinine, Ser 1.08; Potassium 4.7; Sodium 139  Recent Lipid Panel    Component Value Date/Time   CHOL 252 (H) 11/02/2020 1054   TRIG 131 11/02/2020 1054   HDL 40 11/02/2020 1054   CHOLHDL 6.3 (H) 11/02/2020 1054   CHOLHDL 5.7 07/17/2020 1143   VLDL 23 07/17/2020 1143   LDLCALC 188 (H) 11/02/2020 1054    Home Medications   Current Meds  Medication Sig   atorvastatin (LIPITOR) 80 MG tablet TAKE 1 TABLET (80 MG TOTAL) BY MOUTH DAILY.   clopidogrel (PLAVIX) 75 MG tablet Take 1 tablet (75 mg total) by mouth daily.   losartan (COZAAR) 50 MG tablet Take 1 tablet (50 mg total) by mouth daily.   metoprolol tartrate (LOPRESSOR) 25 MG tablet Take 1 tablet (25 mg total) by mouth 2 (two) times daily.   omega-3 acid ethyl esters (LOVAZA) 1 g capsule TAKE 1 CAPSULE (1 G TOTAL) BY MOUTH 2 (TWO) TIMES DAILY.   warfarin (COUMADIN) 6 MG tablet Take 2 to 3 tablets once daily by mouth as directed by [facility]   [DISCONTINUED] enoxaparin (LOVENOX) 100 MG/ML injection INJECT 1 ML (100 MG TOTAL) INTO THE SKIN EVERY 12 (TWELVE) HOURS FOR 10 DAYS.     Review of Systems      All other systems reviewed and are otherwise negative except as noted above.  Physical Exam    VS:  BP (!) 138/100   Pulse 72   Ht 5\' 10"  (1.778 m)   Wt 235 lb (106.6 kg)   SpO2 98%   BMI 33.72 kg/m  , BMI Body mass index is 33.72 kg/m.  Wt Readings from Last 3 Encounters:  07/11/21 235 lb (106.6 kg)  11/02/20 228 lb (103.4 kg)  08/03/20 222 lb (100.7 kg)    GEN: Well nourished, well developed, in no acute distress. HEENT: normal. Neck: Supple,  no JVD, carotid bruits, or masses. Cardiac: RRR, no murmurs, rubs, or gallops. Mechanical valve click noted. No clubbing, cyanosis, edema.  Radials/PT 2+ and equal bilaterally.  Respiratory:  Respirations regular and unlabored, clear to auscultation bilaterally. GI: Soft, nontender, nondistended. MS: No deformity or atrophy. Skin: Warm and dry, no rash. Neuro:  Strength and sensation are intact. Psych: Normal affect.  Assessment & Plan    ICM / HFrEF - Euvolemic and well compensated on exam. GDMT includes Metoprolol. No indication for loop diuretic at this time. Add Losartan as cannot afford Entresto. Future considerations include MRA/SGLT2i.  S/p mechanical MVR - Continue optimal BP control and follow up with Coumadin Clinic.   CAD - Stable with no anginal symptoms. No indication for ischemic evaluation.  GDMT includes atorvastatin, metoprolol, plavix. Heart healthy diet and regular cardiovascular  exercise encouraged.    History of TIA - Emphasized importance of Coumadin, blood pressure, statin control.   HTN - BP not at goal. [place] cost prohibitive. Start Losartan 50mg  QD. Will get repeat BMP on date of next Coumadin Clinic appt 08/02/21 for monitoring. Encouraged to monitor BP at home.   HLD - Continue Atorvastatin 80mg  QD. Lipid panel, direct LDL today. LDL goal <70.   History of medical noncompliance - Reiterated importance of regular visits. Provided FLMA papers (copy scanned to chart) to allow him time for Coumadin Clinic visits and office visits.   Disposition: Follow up in 3 month(s) with Dr. 13/9/22 or APP.  Signed, , NP 07/11/2021, 9:21 PM [facility]

## 2021-07-11 NOTE — Patient Instructions (Addendum)
Medication Instructions:  Your physician has recommended you make the following change in your medication:    STOP Entresto  START Losartan one 50mg  tablet daily  *If you need a refill on your cardiac medications before your next appointment, please call your pharmacy*   Lab Work: Your physician recommends that you return for lab work today: CMP, lipid profile, CBC  If you have labs (blood work) drawn today and your tests are completely normal, you will receive your results only by: MyChart Message (if you have MyChart) OR A paper copy in the mail If you have any lab test that is abnormal or we need to change your treatment, we will call you to review the results.   Testing/Procedures: Your EKG today showed normal sinus rhythm which is a good result.    Follow-Up: At [facility], you and your health needs are our priority.  As part of our continuing mission to provide you with exceptional heart care, we have created designated Provider Care Teams.  These Care Teams include your primary Cardiologist (physician) and Advanced Practice Providers (APPs -  Physician Assistants and Nurse Practitioners) who all work together to provide you with the care you need, when you need it.  We recommend signing up for the patient portal called "MyChart".  Sign up information is provided on this After Visit Summary.  MyChart is used to connect with patients for Virtual Visits (Telemedicine).  Patients are able to view lab/test results, encounter notes, upcoming appointments, etc.  Non-urgent messages can be sent to your provider as well.   To learn more about what you can do with MyChart, go to [facility].    Your next appointment:   3 month(s)  The format for your next appointment:   In Person  Provider:   You may see [email or web address], MD or one of the following Advanced Practice Providers on your designated Care Team:   Meriam Sprague, PA-C Vin Lynd, PA-C [place],  [place], NP    Other Instructions  Heart Healthy Diet Recommendations: A low-salt diet is recommended. Meats should be grilled, baked, or boiled. Avoid fried foods. Focus on lean protein sources like fish or chicken with vegetables and fruits. The American Heart Association is a Alver Sorrow!    Exercise recommendations: The American Heart Association recommends 150 minutes of moderate intensity exercise weekly. Try 30 minutes of moderate intensity exercise 4-5 times per week. This could include walking, jogging, or swimming.

## 2021-07-11 NOTE — Patient Instructions (Signed)
Description   Continue taking 2 tablets daily except for 3 tablets on Tuesdays and Saturdays. Recheck INR in 3 weeks. [facility] [phone].

## 2021-07-12 ENCOUNTER — Other Ambulatory Visit: Payer: Self-pay | Admitting: Nurse Practitioner

## 2021-07-12 ENCOUNTER — Telehealth: Payer: Self-pay | Admitting: Cardiology

## 2021-07-12 DIAGNOSIS — I2510 Atherosclerotic heart disease of native coronary artery without angina pectoris: Secondary | ICD-10-CM

## 2021-07-12 DIAGNOSIS — E785 Hyperlipidemia, unspecified: Secondary | ICD-10-CM

## 2021-07-12 LAB — CBC
Hematocrit: 45 % (ref 37.5–51.0)
Hemoglobin: 15.1 g/dL (ref 13.0–17.7)
MCH: 29.4 pg (ref 26.6–33.0)
MCHC: 33.6 g/dL (ref 31.5–35.7)
MCV: 88 fL (ref 79–97)
Platelets: 219 10*3/uL (ref 150–450)
RBC: 5.14 x10E6/uL (ref 4.14–5.80)
RDW: 13.2 % (ref 11.6–15.4)
WBC: 4 10*3/uL (ref 3.4–10.8)

## 2021-07-12 LAB — LIPID PANEL
Chol/HDL Ratio: 6.1 ratio — ABNORMAL HIGH (ref 0.0–5.0)
Cholesterol, Total: 243 mg/dL — ABNORMAL HIGH (ref 100–199)
HDL: 40 mg/dL (ref >39)
LDL Chol Calc (NIH): 152 mg/dL — ABNORMAL HIGH (ref 0–99)
Triglycerides: 277 mg/dL — ABNORMAL HIGH (ref 0–149)
VLDL Cholesterol Cal: 51 mg/dL — ABNORMAL HIGH (ref 5–40)

## 2021-07-12 LAB — COMPREHENSIVE METABOLIC PANEL
ALT: 27 IU/L (ref 0–44)
AST: 24 IU/L (ref 0–40)
Albumin/Globulin Ratio: 1.6 (ref 1.2–2.2)
Albumin: 4.6 g/dL (ref 4.0–5.0)
Alkaline Phosphatase: 69 IU/L (ref 44–121)
BUN/Creatinine Ratio: 7 — ABNORMAL LOW (ref 9–20)
BUN: 7 mg/dL (ref 6–20)
Bilirubin Total: 0.6 mg/dL (ref 0.0–1.2)
CO2: 24 mmol/L (ref 20–29)
Calcium: 9.2 mg/dL (ref 8.7–10.2)
Chloride: 101 mmol/L (ref 96–106)
Creatinine, Ser: 0.97 mg/dL (ref 0.76–1.27)
Globulin, Total: 2.9 g/dL (ref 1.5–4.5)
Glucose: 91 mg/dL (ref 70–99)
Potassium: 4.4 mmol/L (ref 3.5–5.2)
Sodium: 139 mmol/L (ref 134–144)
Total Protein: 7.5 g/dL (ref 6.0–8.5)
eGFR: 104 mL/min/{1.73_m2} (ref >59)

## 2021-07-12 NOTE — Telephone Encounter (Signed)
Reviewed lab results with patient. He admits that he is not consistent with taking atorvastatin and would like to improve adherence prior to changing medications. He agrees to return to [facility] for lab work in 8 weeks. He thanked me for the call.

## 2021-07-12 NOTE — Telephone Encounter (Signed)
Pt is calling regarding his Lab results

## 2021-07-13 ENCOUNTER — Telehealth (HOSPITAL_BASED_OUTPATIENT_CLINIC_OR_DEPARTMENT_OTHER): Payer: Self-pay | Admitting: Family

## 2021-07-13 DIAGNOSIS — I2510 Atherosclerotic heart disease of native coronary artery without angina pectoris: Secondary | ICD-10-CM

## 2021-07-13 NOTE — Telephone Encounter (Signed)
Message Received: Benjamin Rodriguez, Storm Frisk, NP sent to Jeanene Erb Can you please order BMP at [facility] for 08/02/21 at his next coumadin clinic appt and make him aware? TY!   --Spoke to pt and informed him to get blood work on 11/9, same day as coumadin appointment at [facility] office. Informed pt the labs are not fasting. Pt verbalized thanks for the call.  --Order placed and lab scheduled.

## 2021-07-19 ENCOUNTER — Other Ambulatory Visit (HOSPITAL_COMMUNITY): Payer: Self-pay

## 2021-08-02 ENCOUNTER — Other Ambulatory Visit (HOSPITAL_COMMUNITY): Payer: Self-pay

## 2021-08-02 ENCOUNTER — Other Ambulatory Visit: Payer: 59

## 2021-08-02 ENCOUNTER — Encounter: Payer: Self-pay | Admitting: *Deleted

## 2021-08-02 ENCOUNTER — Other Ambulatory Visit: Payer: Self-pay | Admitting: Cardiology

## 2021-08-02 ENCOUNTER — Ambulatory Visit (INDEPENDENT_AMBULATORY_CARE_PROVIDER_SITE_OTHER): Payer: 59 | Admitting: *Deleted

## 2021-08-02 ENCOUNTER — Other Ambulatory Visit: Payer: Self-pay

## 2021-08-02 DIAGNOSIS — Z5181 Encounter for therapeutic drug level monitoring: Secondary | ICD-10-CM

## 2021-08-02 DIAGNOSIS — G459 Transient cerebral ischemic attack, unspecified: Secondary | ICD-10-CM | POA: Diagnosis not present

## 2021-08-02 DIAGNOSIS — Z952 Presence of prosthetic heart valve: Secondary | ICD-10-CM

## 2021-08-02 DIAGNOSIS — I2510 Atherosclerotic heart disease of native coronary artery without angina pectoris: Secondary | ICD-10-CM

## 2021-08-02 LAB — POCT INR: INR: 1.9 — AB (ref 2.0–3.0)

## 2021-08-02 MED ORDER — WARFARIN SODIUM 6 MG PO TABS
12.0000 mg | ORAL_TABLET | Freq: Every day | ORAL | 0 refills | Status: DC
  Filled 2021-08-02: qty 65, 30d supply, fill #0

## 2021-08-02 NOTE — Patient Instructions (Signed)
Description   Today and tomorrow take 2.5 tablets then continue taking 2 tablets daily except for 3 tablets on Tuesdays and Saturdays. Recheck INR in 2 weeks. [facility] [phone].

## 2021-08-03 LAB — BASIC METABOLIC PANEL
BUN/Creatinine Ratio: 13 (ref 9–20)
BUN: 14 mg/dL (ref 6–20)
CO2: 24 mmol/L (ref 20–29)
Calcium: 9.8 mg/dL (ref 8.7–10.2)
Chloride: 102 mmol/L (ref 96–106)
Creatinine, Ser: 1.05 mg/dL (ref 0.76–1.27)
Glucose: 91 mg/dL (ref 70–99)
Potassium: 4.4 mmol/L (ref 3.5–5.2)
Sodium: 140 mmol/L (ref 134–144)
eGFR: 94 mL/min/{1.73_m2} (ref >59)

## 2021-09-06 ENCOUNTER — Other Ambulatory Visit (HOSPITAL_COMMUNITY): Payer: Self-pay

## 2021-09-06 MED ORDER — IBUPROFEN 400 MG PO TABS
400.0000 mg | ORAL_TABLET | Freq: Four times a day (QID) | ORAL | 0 refills | Status: AC | PRN
  Filled 2021-09-06: qty 16, 2d supply, fill #0

## 2021-09-06 MED ORDER — AMOXICILLIN 500 MG PO CAPS
2000.0000 mg | ORAL_CAPSULE | ORAL | 0 refills | Status: AC
  Filled 2021-09-06: qty 16, 4d supply, fill #0

## 2021-09-19 ENCOUNTER — Telehealth: Payer: Self-pay

## 2021-09-19 DIAGNOSIS — Z952 Presence of prosthetic heart valve: Secondary | ICD-10-CM

## 2021-09-19 NOTE — Telephone Encounter (Signed)
° °  Pre-operative Risk Assessment    Patient Name: Benjamin Rodriguez  DOB: 08/29/1986 MRN: [id number]{   Request for Surgical Clearance    Procedure:  Dental Extraction - Amount of Teeth to be Pulled:  4 and a bone graft  {  Date of Surgery:  Clearance TBD                                 Surgeon:  Dr. Graylon Gunning  Surgeon's Group or Practice Name:  [facility] Phone number:  [phone] Fax number:  [phone]   Type of Clearance Requested:   - Medical    Type of Anesthesia:  General    Additional requests/questions:  Given patients history of recent MI and stroke are you ok with him undergoing anesthesia and surgery in the office setting. Also, please provide current INR   Signed, Vernard Gambles   09/19/2021, 4:53 PM

## 2021-09-20 ENCOUNTER — Ambulatory Visit (INDEPENDENT_AMBULATORY_CARE_PROVIDER_SITE_OTHER): Payer: BLUE CROSS/BLUE SHIELD | Admitting: *Deleted

## 2021-09-20 ENCOUNTER — Other Ambulatory Visit: Payer: Self-pay

## 2021-09-20 ENCOUNTER — Other Ambulatory Visit (HOSPITAL_COMMUNITY): Payer: Self-pay

## 2021-09-20 DIAGNOSIS — Z5181 Encounter for therapeutic drug level monitoring: Secondary | ICD-10-CM

## 2021-09-20 DIAGNOSIS — G459 Transient cerebral ischemic attack, unspecified: Secondary | ICD-10-CM | POA: Diagnosis not present

## 2021-09-20 DIAGNOSIS — Z952 Presence of prosthetic heart valve: Secondary | ICD-10-CM

## 2021-09-20 LAB — POCT INR: INR: 3.2 — AB (ref 2.0–3.0)

## 2021-09-20 MED ORDER — WARFARIN SODIUM 6 MG PO TABS
12.0000 mg | ORAL_TABLET | Freq: Every day | ORAL | 0 refills | Status: DC
  Filled 2021-09-20 – 2021-10-13 (×2): qty 65, 30d supply, fill #0

## 2021-09-20 NOTE — Patient Instructions (Signed)
Description   Continue taking Warfarin 2 tablets daily except for 3 tablets on Tuesdays and Saturdays. Recheck INR in 4 weeks. [facility]in Clinic [phone].

## 2021-09-22 NOTE — Telephone Encounter (Signed)
Our office received an updated clearance request today.   Per Dr. Graylon Gunning: Given pt hx of recent MI and stroke, are you comfortable with the proposed surgery and anesthesia? In an office setting? Can this pt hold coumadin with pharmacy consult? SBE will be provided by our office.   Pt has appt with Gillian Shields, NP 10/12/21. Will forward clearance notes to NP for upcoming appt. Will send FYI to requesting office pt has appt 10/12/21 for pre op clearance. Once pt has been cleared we will be sure to fax notes to your office.

## 2021-09-26 NOTE — Telephone Encounter (Signed)
Patient with diagnosis of MVR on warfarin for anticoagulation.    Procedure: 4 dental extractions Date of procedure: TBD   CrCl 151mL/min Platelet count 219K  Patient DOES require pre-op antibiotics for dental procedure. Patient has active amoxicillin Rx   Per office protocol, patient can hold warfarin for 5 days prior to procedure.    Patient WILL need bridging with Lovenox (enoxaparin) around procedure.    Please route back to anticoag pool when surgery is scheduled

## 2021-09-26 NOTE — Telephone Encounter (Signed)
Last seen 07/11/21. He had NSTEMI 2019 in setting of not taking Coumadin with thromboembolic subtotal occlusion of distal mid LAD with successful balloon angioplasty.  TIA 07/18/20 in setting of stopping Coumadin with mechanical mitral valve. Known HFrEf. At last clinic visit Losartan added as could not afford Entresto. Metoprolol was continued.   Anticipate he will require Lovenox bridging. Will route to Pharmacy team and Dr. Shari Prows for their input.  Given that his MI and TIA were both >1 year ago in absence of anginal symptoms I suspect he will be appropriate for 4 dental extractions  Alver Sorrow, NP

## 2021-09-27 NOTE — Telephone Encounter (Signed)
Scheduled to see Gillian Shields on 1/19

## 2021-09-28 ENCOUNTER — Telehealth: Payer: Self-pay | Admitting: Cardiology

## 2021-09-28 ENCOUNTER — Other Ambulatory Visit (HOSPITAL_COMMUNITY): Payer: Self-pay

## 2021-09-28 NOTE — Telephone Encounter (Signed)
I s/w Pam with dental office. Confirmed procedure not of urgent matter and ok to wait for cardiology appt 10/12/21.

## 2021-09-28 NOTE — Telephone Encounter (Signed)
Will route to callback to determine urgency of dental procedure to ensure not needed before clinic f/u OV 10/12/21. If not urgent, anticipate keeping follow-up appt as scheduled.

## 2021-09-28 NOTE — Telephone Encounter (Signed)
Called the DDS office to determine if dental procedure is of urgent matter. Left message for DDS office to call back and let our office know if urgent. Otherwise the pt has appt 10/12/21 with Laurann Montana, NP. I will send FYI to requesting office pt has appt 10/12/21.

## 2021-09-28 NOTE — Telephone Encounter (Signed)
I s/w Pam with dental office. Confirmed procedure not of urgent matter and ok to wait for cardiology appt 10/12/21.  °

## 2021-09-28 NOTE — Telephone Encounter (Signed)
Pam from [facility] on the line wanting to speak w/ you in regards to this pt... please advise

## 2021-10-12 ENCOUNTER — Encounter (HOSPITAL_BASED_OUTPATIENT_CLINIC_OR_DEPARTMENT_OTHER): Payer: Self-pay | Admitting: Family

## 2021-10-12 ENCOUNTER — Other Ambulatory Visit (HOSPITAL_COMMUNITY): Payer: Self-pay

## 2021-10-12 ENCOUNTER — Ambulatory Visit (HOSPITAL_BASED_OUTPATIENT_CLINIC_OR_DEPARTMENT_OTHER): Payer: BLUE CROSS/BLUE SHIELD | Admitting: Family

## 2021-10-12 ENCOUNTER — Other Ambulatory Visit: Payer: Self-pay

## 2021-10-12 VITALS — BP 130/80 | HR 75 | Ht 70.0 in | Wt 239.0 lb

## 2021-10-12 DIAGNOSIS — I255 Ischemic cardiomyopathy: Secondary | ICD-10-CM | POA: Diagnosis not present

## 2021-10-12 DIAGNOSIS — E785 Hyperlipidemia, unspecified: Secondary | ICD-10-CM

## 2021-10-12 DIAGNOSIS — I25118 Atherosclerotic heart disease of native coronary artery with other forms of angina pectoris: Secondary | ICD-10-CM

## 2021-10-12 DIAGNOSIS — Z01818 Encounter for other preprocedural examination: Secondary | ICD-10-CM | POA: Diagnosis not present

## 2021-10-12 DIAGNOSIS — I10 Essential (primary) hypertension: Secondary | ICD-10-CM

## 2021-10-12 DIAGNOSIS — Z952 Presence of prosthetic heart valve: Secondary | ICD-10-CM

## 2021-10-12 MED ORDER — METOPROLOL SUCCINATE ER 25 MG PO TB24
25.0000 mg | ORAL_TABLET | Freq: Every day | ORAL | 3 refills | Status: DC
  Filled 2021-10-12: qty 90, 90d supply, fill #0
  Filled 2022-03-05: qty 90, 90d supply, fill #1

## 2021-10-12 NOTE — Patient Instructions (Addendum)
Medication Instructions:  Your physician has recommended you make the following change in your medication:   STOP Metoprolol Tartrate  START Metoprolol Succinate one 25mg  tablet daily  You will need to stop Warfarin and start Lovenox prior to your dental extractions. This can be addressed at your 10/18/21 Coumadin Clinic appointment. You should set your dental visit for after 10/25/21, ideally 10/30/21 or later.  Reminder to take your Amoxicillin one hour prior to your dental procedure.   *If you need a refill on your cardiac medications before your next appointment, please call your pharmacy*  Lab Work: None ordered today.   Testing/Procedures: Your EKG today showed normal sinus rhythm.   Follow-Up: At [facility], you and your health needs are our priority.  As part of our continuing mission to provide you with exceptional heart care, we have created designated Provider Care Teams.  These Care Teams include your primary Cardiologist (physician) and Advanced Practice Providers (APPs -  Physician Assistants and Nurse Practitioners) who all work together to provide you with the care you need, when you need it.  We recommend signing up for the patient portal called "MyChart".  Sign up information is provided on this After Visit Summary.  MyChart is used to connect with patients for Virtual Visits (Telemedicine).  Patients are able to view lab/test results, encounter notes, upcoming appointments, etc.  Non-urgent messages can be sent to your provider as well.   To learn more about what you can do with MyChart, go to [facility].    Your next appointment:   3 month(s)  The format for your next appointment:   In Person  Provider:   [email or web address], MD or Meriam Sprague, NP     Other Instructions  Alver Sorrow, NP will send a referral to our pharmacy team to discuss [facility] for weight loss.   Semaglutide Injection (Weight Management) What is this  medication? SEMAGLUTIDE (SEM a GLOO tide) promotes weight loss. It may also be used to maintain weight loss. It works by decreasing appetite. Changes to diet and exercise are often combined with this medication. This medicine may be used for other purposes; ask your health care provider or pharmacist if you have questions. COMMON BRAND NAME(S): [facility] What should I tell my care team before I take this medication? They need to know if you have any of these conditions: Endocrine tumors (MEN 2) or if someone in your family had these tumors Eye disease, vision problems Gallbladder disease History of depression or mental health disease History of pancreatitis Kidney disease Stomach or intestine problems Suicidal thoughts, plans, or attempt; a previous suicide attempt by you or a family member Thyroid cancer or if someone in your family had thyroid cancer An unusual or allergic reaction to semaglutide, other medications, foods, dyes, or preservatives Pregnant or trying to get pregnant Breast-feeding How should I use this medication? This medication is injected under the skin. You will be taught how to prepare and give it. Take it as directed on the prescription label. It is given once every week (every 7 days). Keep taking it unless your care team tells you to stop. It is important that you put your used needles and pens in a special sharps container. Do not put them in a trash can. If you do not have a sharps container, call your pharmacist or care team to get one. A special MedGuide will be given to you by the pharmacist with each prescription and refill. Be sure to  read this information carefully each time. This medication comes with INSTRUCTIONS FOR USE. Ask your pharmacist for directions on how to use this medication. Read the information carefully. Talk to your pharmacist or care team if you have questions. Talk to your care team about the use of this medication in children. Special care may  be needed. Overdosage: If you think you have taken too much of this medicine contact a poison control center or emergency room at once. NOTE: This medicine is only for you. Do not share this medicine with others. What if I miss a dose? If you miss a dose and the next scheduled dose is more than 2 days away, take the missed dose as soon as possible. If you miss a dose and the next scheduled dose is less than 2 days away, do not take the missed dose. Take the next dose at your regular time. Do not take double or extra doses. If you miss your dose for 2 weeks or more, take the next dose at your regular time or call your care team to talk about how to restart this medication. What may interact with this medication? Insulin and other medications for diabetes This list may not describe all possible interactions. Give your health care provider a list of all the medicines, herbs, non-prescription drugs, or dietary supplements you use. Also tell them if you smoke, drink alcohol, or use illegal drugs. Some items may interact with your medicine. What should I watch for while using this medication? Visit your care team for regular checks on your progress. It may be some time before you see the benefit from this medication. Drink plenty of fluids while taking this medication. Check with your care team if you have severe diarrhea, nausea, and vomiting, or if you sweat a lot. The loss of too much body fluid may make it dangerous for you to take this medication. This medication may affect blood sugar levels. Ask your care team if changes in diet or medications are needed if you have diabetes. If you or your family notice any changes in your behavior, such as new or worsening depression, thoughts of harming yourself, anxiety, other unusual or disturbing thoughts, or memory loss, call your care team right away. Women should inform their care team if they wish to become pregnant or think they might be pregnant. Losing  weight while pregnant is not advised and may cause harm to the unborn child. Talk to your care team for more information. What side effects may I notice from receiving this medication? Side effects that you should report to your care team as soon as possible: Allergic reactions--skin rash, itching, hives, swelling of the face, lips, tongue, or throat Change in vision Dehydration--increased thirst, dry mouth, feeling faint or lightheaded, headache, dark yellow or brown urine Gallbladder problems--severe stomach pain, nausea, vomiting, fever Heart palpitations--rapid, pounding, or irregular heartbeat Kidney injury--decrease in the amount of urine, swelling of the ankles, hands, or feet Pancreatitis--severe stomach pain that spreads to your back or gets worse after eating or when touched, fever, nausea, vomiting Thoughts of suicide or self-harm, worsening mood, feelings of depression Thyroid cancer--new mass or lump in the neck, pain or trouble swallowing, trouble breathing, hoarseness Side effects that usually do not require medical attention (report to your care team if they continue or are bothersome): Diarrhea Loss of appetite Nausea Stomach pain Vomiting This list may not describe all possible side effects. Call your doctor for medical advice about side effects. You may  report side effects to FDA at 1-800-FDA-1088. Where should I keep my medication? Keep out of the reach of children and pets. Refrigeration (preferred): Store in the refrigerator. Do not freeze. Keep this medication in the original container until you are ready to take it. Get rid of any unused medication after the expiration date. Room temperature: If needed, prior to cap removal, the pen can be stored at room temperature for up to 28 days. Protect from light. If it is stored at room temperature, get rid of any unused medication after 28 days or after it expires, whichever is first. It is important to get rid of the  medication as soon as you no longer need it or it is expired. You can do this in two ways: Take the medication to a medication take-back program. Check with your pharmacy or law enforcement to find a location. If you cannot return the medication, follow the directions in the MedGuide. NOTE: This sheet is a summary. It may not cover all possible information. If you have questions about this medicine, talk to your doctor, pharmacist, or health care provider.  2022 Elsevier/Gold Standard (2020-12-16 00:00:00)

## 2021-10-12 NOTE — Progress Notes (Signed)
Office Visit    Patient Name: Benjamin Rodriguez Date of Encounter: 10/12/2021  PCP:  Patient, No Pcp Per (Inactive)   [place]  Cardiologist:  Freada Bergeron, MD  Advanced Practice Provider:  No care team member to display Electrophysiologist:  None      Chief Complaint    Benjamin Rodriguez is a 36 y.o. 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  Past Medical History    Past Medical History:  Diagnosis Date   CAD (coronary artery disease)    a. 08/06/18- LHC- mechanical valve thromboembolic subtotal occlusion of distal-mid LAD with successful balloon angioplasty    History of prosthetic mitral valve 2016   Hypertension    Past Surgical History:  Procedure Laterality Date   CARDIAC SURGERY     CORONARY BALLOON ANGIOPLASTY N/A 08/06/2018   Procedure: CORONARY BALLOON ANGIOPLASTY;  Surgeon: Leonie Man, MD;  Location: [place] [facility];  Service: Cardiovascular;  Laterality: N/A;   LEFT HEART CATH AND CORONARY ANGIOGRAPHY N/A 08/06/2018   Procedure: LEFT HEART CATH AND CORONARY ANGIOGRAPHY;  Surgeon: Leonie Man, MD;  Location: [place] [facility];  Service: Cardiovascular;  Laterality: N/A;   MITRAL VALVE REPLACEMENT      Allergies  No Known Allergies  History of Present Illness    Benjamin Rodriguez is a 37 y.o. 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  Admitted with STEMI 2019 felt to be secondary to embolization from mechanical valve thrombus. Suspected thromboembolic sub total occlusion of distal mid LAD with successful balloon angioplasty restoring TIMI-3 flow. Resulted in occlusion of 1 mm distal diagonal branch. LVEF with  apical anterior hypokinesis. Coumadin was resumed as he had not been taking.   He had TIA 07/18/20 in setting of mechanical mitral valve and stopping Coumadin 2 weeks prior to event. INR was 1.0 on admit. Echo 07/17/21 LVEF 45-50%, mechanical mitral valve mean gradient 5 mm essentially unchanged from prior 08/03/20. He was started on Entresto.   He was seen 10/2020 by Estella Husk, PA noted to be taking Entresto only 3 times per week. Seen 06/2021 - he was not taking cardiac medications routinely. Entresto transitioned to Losartan.   Presents today for follow up. Started new job working from home for [employer]. Also works part time as Publishing rights manager. Recently purchased treadmill to walk on while working. Tells me eating heart healthy diet has been difficult with holidays. He is not exercising though plans to start walking regimen on treadmill. Hopeful to lose weight. Other than his Warfarin, not taking medicines regularly.    EKGs/Labs/Other Studies Reviewed:   The following studies were reviewed today:  Echo 07/17/20  1. Septal hypokinesis. Left ventricular ejection fraction, by estimation,  is 45 to 50%. The left ventricle has mildly decreased function. The left  ventricle demonstrates global hypokinesis. Left ventricular diastolic  parameters are indeterminate.   2. Right ventricular systolic function is normal. The right ventricular  size is normal. There is normal pulmonary artery systolic pressure.   3. Mechanical mitral valve. Mean gradient 5 mmHg, essentially unchanged  from 05/2019. Valve leaflets appear to be moving  freely. . The mitral valve  has been repaired/replaced. No evidence of mitral valve regurgitation. No  evidence of mitral stenosis. The   mean mitral valve gradient is 5.0 mmHg. There is a St. Jude mechanical  valve present in the mitral position.   4. The aortic valve is tricuspid. Aortic valve regurgitation is not  visualized. No aortic stenosis is present.    5. The inferior vena cava is normal in size with greater than 50%  respiratory variability, suggesting right atrial pressure of 3 mmHg.   EKG:  EKG is  ordered today.  The ekg ordered today demonstrates NSR 75 bpm with occasional PVC.   Recent Labs: 07/11/2021: ALT 27; Hemoglobin 15.1; Platelets 219 08/02/2021: BUN 14; Creatinine, Ser 1.05; Potassium 4.4; Sodium 140  Recent Lipid Panel    Component Value Date/Time   CHOL 243 (H) 07/11/2021 1119   TRIG 277 (H) 07/11/2021 1119   HDL 40 07/11/2021 1119   CHOLHDL 6.1 (H) 07/11/2021 1119   CHOLHDL 5.7 07/17/2020 1143   VLDL 23 07/17/2020 1143   LDLCALC 152 (H) 07/11/2021 1119    Home Medications   Current Meds  Medication Sig   amoxicillin (AMOXIL) 500 MG capsule Take 4 capsules (2,000 mg total) by mouth one hour before dental appointment.   atorvastatin (LIPITOR) 80 MG tablet TAKE 1 TABLET (80 MG TOTAL) BY MOUTH DAILY.   clopidogrel (PLAVIX) 75 MG tablet Take 1 tablet (75 mg total) by mouth daily.   ibuprofen (ADVIL) 400 MG tablet Take 1-2 tablets (400-800 mg total) by mouth 3-4 times daily as needed for pain   losartan (COZAAR) 50 MG tablet Take 1 tablet (50 mg total) by mouth daily.   metoprolol succinate (TOPROL XL) 25 MG 24 hr tablet Take 1 tablet (25 mg total) by mouth daily.   omega-3 acid ethyl esters (LOVAZA) 1 g capsule TAKE 1 CAPSULE (1 G TOTAL) BY MOUTH 2 (TWO) TIMES DAILY.   warfarin (COUMADIN) 6 MG tablet Take 2-3 tablets (12-18 mg total) by mouth daily as directed by Coumadin Clinic   [DISCONTINUED] metoprolol tartrate (LOPRESSOR) 25 MG tablet Take 1 tablet (25 mg total) by mouth 2 (two) times daily.     Review of Systems      All other systems reviewed and are otherwise negative except as noted above.  Physical Exam    VS:  BP 130/80 (BP Location: Left Arm, Patient Position: Sitting, Cuff Size: Normal)    Pulse 75    Ht 5\' 10"  (1.778 m)    Wt 239 lb (108.4 kg)    SpO2 94%    BMI 34.29 kg/m  , BMI Body mass index is  34.29 kg/m.  Wt Readings from Last 3 Encounters:  10/12/21 239 lb (108.4 kg)  07/11/21 235 lb (106.6 kg)  11/02/20 228 lb (103.4 kg)    GEN: Well nourished, well developed, in no acute distress. HEENT: normal. Neck: Supple, no JVD, carotid bruits, or masses. Cardiac: RRR, no murmurs, rubs, or gallops. Mechanical valve click noted. No clubbing, cyanosis, edema.  Radials/PT 2+ and equal bilaterally.  Respiratory:  Respirations regular and unlabored, clear to auscultation bilaterally. GI: Soft, nontender, nondistended. MS: No deformity or atrophy. Skin: Warm and dry, no rash. Neuro:  Strength and sensation are intact. Psych: Normal affect.  Assessment & Plan    Preoperative clearance - Upcoming extraction of 4 teeth. According to the Revised Cardiac Risk Index (RCRI), his Perioperative Risk of Major Cardiac Event is (%): 11. His  Functional  Capacity in METs is: 5.81 according to the Duke Activity Status Index (DASI). He is acceptable risk for dental extraction. He does require preop abx for dental procedure. Recommend Amoxicillin 2g one hour prior to procedure. He has active prescription. He may hold Warfarin 5 days prior to procedure but WILL need bridging with Lovenox. He has upcoming appt with Coumadin Clinic 10/18/21.  Medication management - Taking Warfarin consistently, but not taking any of his other medications consistently. We discussed tactics for medication adherence such as setting an alarm, keeping on his work desk. Discussed the importance of medical therapy for ICM, HFrEF, CAD.  ICM / HFrEF - Echo 06/2020 LVEF 45-50%. Euvolemic and well compensated on exam. GDMT includes Metoprolol, Losartan. No indication for loop diuretic at this time. Delene Loll previously cost prohibitive.  Future considerations include MRA/SGLT2i.  S/p mechanical MVR - Continue optimal BP control and follow up with Coumadin Clinic.   CAD - Stable with no anginal symptoms. No indication for ischemic  evaluation.  GDMT includes atorvastatin, metoprolol, plavix. Heart healthy diet and regular cardiovascular exercise encouraged.    History of TIA - Emphasized importance of Coumadin, blood pressure, statin control.   HTN - BP goal <130/80. Not taking Losartan consistently. Educated on importance of Start Losartan 50mg  QD. Will get repeat BMP on date of next Coumadin Clinic appt 08/02/21 for monitoring. Encouraged to monitor BP at home.   Obesity - Weight loss via diet and exercise encouraged. Discussed the impact being overweight would have on cardiovascular risk. BMI 34%. Additional risk factors of HFrEF, CAD. He is interested in medical therapy such as Wegovy for weight loss. Will refer to our pharmacy team.   HLD - Continue Atorvastatin 80mg  QD. Lipid panel, direct LDL today. LDL goal <70.   Disposition: Follow up in 3 month(s) with Dr. Johney Frame or APP.  Signed, Loel Dubonnet, NP 10/12/2021, 5:04 PM [place]

## 2021-10-13 ENCOUNTER — Telehealth: Payer: Self-pay | Admitting: Cardiology

## 2021-10-13 ENCOUNTER — Other Ambulatory Visit (HOSPITAL_COMMUNITY): Payer: Self-pay

## 2021-10-13 NOTE — Telephone Encounter (Signed)
Faxed over surgical clearance recommendations over to [facility] at [phone].

## 2021-10-13 NOTE — Telephone Encounter (Signed)
Preoperative team, please forward recommendations to requesting office.  Thank you.  Thomasene Ripple. Chyanne Kohut NP-C    10/13/2021, 1:38 PM [facility] [address] Office [phone] Fax [phone]

## 2021-10-13 NOTE — Telephone Encounter (Signed)
Benjamin Rodriguez from [place] oral just received the clearance but states clearance for anesthesia was left off.. please advise

## 2021-10-13 NOTE — Telephone Encounter (Signed)
Benjamin Rodriguez may undergo his surgery/procedure with anesthesia in the office setting.  Preoperative cardiac evaluation per Gillian Shields, NP-C   "Upcoming extraction of 4 teeth. According to the Revised Cardiac Risk Index (RCRI), his Perioperative Risk of Major Cardiac Event is (%): 11. His  Functional Capacity in METs is: 5.81 according to the Duke Activity Status Index (DASI). He is acceptable risk for dental extraction. He does require preop abx for dental procedure. Recommend Amoxicillin 2g one hour prior to procedure. He has active prescription. He may hold Warfarin 5 days prior to procedure but WILL need bridging with Lovenox. He has upcoming appt with Coumadin Clinic 10/18/21."  Thomasene Ripple. Majour Frei NP-C    10/13/2021, 1:37 PM [facility] [address] Office [phone] Fax [phone]

## 2021-10-18 ENCOUNTER — Other Ambulatory Visit: Payer: Self-pay

## 2021-10-18 ENCOUNTER — Encounter (HOSPITAL_BASED_OUTPATIENT_CLINIC_OR_DEPARTMENT_OTHER): Payer: Self-pay

## 2021-10-18 ENCOUNTER — Ambulatory Visit (INDEPENDENT_AMBULATORY_CARE_PROVIDER_SITE_OTHER): Payer: BLUE CROSS/BLUE SHIELD

## 2021-10-18 DIAGNOSIS — Z5181 Encounter for therapeutic drug level monitoring: Secondary | ICD-10-CM

## 2021-10-18 DIAGNOSIS — Z952 Presence of prosthetic heart valve: Secondary | ICD-10-CM | POA: Diagnosis not present

## 2021-10-18 LAB — POCT INR: INR: 2.3 (ref 2.0–3.0)

## 2021-10-18 NOTE — Patient Instructions (Addendum)
Description   Take 2.5 tablets today and 2.5 tablets tomorrow and then continue taking Warfarin 2 tablets daily except for 3 tablets on Tuesdays and Saturdays. Recheck INR in 3 weeks. Coumadin Clinic [phone].

## 2021-10-25 ENCOUNTER — Telehealth: Payer: Self-pay | Admitting: Pharmacist

## 2021-10-25 NOTE — Telephone Encounter (Signed)
Pt referred by Dr Cristal Deer to initiate [id number] therapy for weight loss. Already has an appt scheduled at [facility] office on 2/20.  Will submit [facility] prior authorization and follow up with pt once determination is made.

## 2021-10-26 NOTE — Telephone Encounter (Signed)
[facility] prior authorization denied, weight loss medications are excluded from coverage on patient's plan. Will try submitting PA for Ozempic to see if one is needed, pt does not have DM though.

## 2021-10-30 NOTE — Telephone Encounter (Addendum)
Ozempic prior authorization denied since pt does not have DM. Insurance does not cover weight loss meds either Reginal Lutes, [place]).   Called pt and advised him of the above. Canceled upcoming PharmD appt since this will no longer be needed. Advised pt to let us know if his insurance changes in the future and we can revisit coverage.

## 2021-11-13 ENCOUNTER — Ambulatory Visit: Payer: BLUE CROSS/BLUE SHIELD

## 2021-11-21 ENCOUNTER — Other Ambulatory Visit: Payer: Self-pay

## 2021-11-21 ENCOUNTER — Ambulatory Visit (INDEPENDENT_AMBULATORY_CARE_PROVIDER_SITE_OTHER): Payer: BLUE CROSS/BLUE SHIELD

## 2021-11-21 ENCOUNTER — Other Ambulatory Visit (HOSPITAL_COMMUNITY): Payer: Self-pay

## 2021-11-21 DIAGNOSIS — G459 Transient cerebral ischemic attack, unspecified: Secondary | ICD-10-CM | POA: Diagnosis not present

## 2021-11-21 DIAGNOSIS — Z952 Presence of prosthetic heart valve: Secondary | ICD-10-CM | POA: Diagnosis not present

## 2021-11-21 DIAGNOSIS — Z5181 Encounter for therapeutic drug level monitoring: Secondary | ICD-10-CM | POA: Diagnosis not present

## 2021-11-21 LAB — POCT INR: INR: 1.8 — AB (ref 2.0–3.0)

## 2021-11-21 MED ORDER — WARFARIN SODIUM 6 MG PO TABS
12.0000 mg | ORAL_TABLET | Freq: Every day | ORAL | 0 refills | Status: DC
  Filled 2021-11-21: qty 70, 30d supply, fill #0

## 2021-11-21 NOTE — Patient Instructions (Signed)
Description   Take 3 tablets today and tomorrow and then resume same dosage of Warfarin 2 tablets daily except for 3 tablets on Tuesdays and Saturdays. Recheck INR in 2 weeks. [facility] [phone].

## 2021-12-07 ENCOUNTER — Other Ambulatory Visit: Payer: Self-pay

## 2021-12-07 ENCOUNTER — Ambulatory Visit (INDEPENDENT_AMBULATORY_CARE_PROVIDER_SITE_OTHER): Payer: BLUE CROSS/BLUE SHIELD | Admitting: *Deleted

## 2021-12-07 DIAGNOSIS — Z5181 Encounter for therapeutic drug level monitoring: Secondary | ICD-10-CM | POA: Diagnosis not present

## 2021-12-07 DIAGNOSIS — Z952 Presence of prosthetic heart valve: Secondary | ICD-10-CM

## 2021-12-07 LAB — POCT INR: INR: 1.9 — AB (ref 2.0–3.0)

## 2021-12-07 NOTE — Patient Instructions (Addendum)
Description   ?Take 3 tablets of warfarin today.  ?Tomorrow take 2.5 tablets of warfarin  ? Then continue to take warfarin 2 tablets daily except for 3 tablets on Tuesday and Saturday. Recheck INR in 1.5 wks. PLEASE LET us KNOW WHEN DENTAL PROCEDURE SCHEDULED. WILL NEED BRIDGING WITH LOVENOX.  [facility] [phone].  ?  ?  ?

## 2022-01-23 ENCOUNTER — Other Ambulatory Visit (HOSPITAL_COMMUNITY): Payer: Self-pay

## 2022-01-23 MED ORDER — PROCHLORPERAZINE MALEATE 10 MG PO TABS
10.0000 mg | ORAL_TABLET | Freq: Two times a day (BID) | ORAL | 0 refills | Status: AC | PRN
  Filled 2022-01-23: qty 10, 5d supply, fill #0

## 2022-02-01 ENCOUNTER — Other Ambulatory Visit (HOSPITAL_COMMUNITY): Payer: Self-pay

## 2022-02-15 ENCOUNTER — Other Ambulatory Visit: Payer: Self-pay | Admitting: Cardiovascular Disease

## 2022-02-15 DIAGNOSIS — Z952 Presence of prosthetic heart valve: Secondary | ICD-10-CM

## 2022-02-15 DIAGNOSIS — G459 Transient cerebral ischemic attack, unspecified: Secondary | ICD-10-CM

## 2022-02-15 DIAGNOSIS — Z5181 Encounter for therapeutic drug level monitoring: Secondary | ICD-10-CM

## 2022-02-15 NOTE — Telephone Encounter (Signed)
Pt is overdue for an Anticoagulation Appt on 12/18/21; called pt for an appt.

## 2022-02-20 ENCOUNTER — Other Ambulatory Visit (HOSPITAL_COMMUNITY): Payer: Self-pay

## 2022-02-20 ENCOUNTER — Other Ambulatory Visit: Payer: Self-pay | Admitting: Cardiovascular Disease

## 2022-02-20 DIAGNOSIS — G459 Transient cerebral ischemic attack, unspecified: Secondary | ICD-10-CM

## 2022-02-20 DIAGNOSIS — Z952 Presence of prosthetic heart valve: Secondary | ICD-10-CM

## 2022-02-20 DIAGNOSIS — Z5181 Encounter for therapeutic drug level monitoring: Secondary | ICD-10-CM

## 2022-02-21 MED ORDER — WARFARIN SODIUM 6 MG PO TABS: 12.0000 mg | ORAL_TABLET | Freq: Every day | ORAL | 0 refills | Status: DC

## 2022-02-21 NOTE — Telephone Encounter (Signed)
Pt returned called and stated he only had 6 days left of Warfarin. Scheduled anticoagulation clinic appt for next Thursday 03/01/22 at 3:45pm and sent a refill to requested pharmacy to last until upcoming appt.

## 2022-02-21 NOTE — Telephone Encounter (Signed)
Prescription refill request received for warfarin Lov: 10/12/21 Dan Humphreys)  Next INR check: OVERDUE  Warfarin tablet strength: 6mg   Called pt to schedule anticoagulation appt. No answer. Left message to call back.

## 2022-02-21 NOTE — Addendum Note (Signed)
Addended by: Memory Dance on: 02/21/2022 08:05 AM   Modules accepted: Orders

## 2022-03-05 ENCOUNTER — Other Ambulatory Visit (HOSPITAL_COMMUNITY): Payer: Self-pay

## 2022-03-05 ENCOUNTER — Ambulatory Visit (INDEPENDENT_AMBULATORY_CARE_PROVIDER_SITE_OTHER): Payer: BLUE CROSS/BLUE SHIELD

## 2022-03-05 ENCOUNTER — Other Ambulatory Visit: Payer: Self-pay | Admitting: Physician Assistant

## 2022-03-05 DIAGNOSIS — Z952 Presence of prosthetic heart valve: Secondary | ICD-10-CM | POA: Diagnosis not present

## 2022-03-05 DIAGNOSIS — Z5181 Encounter for therapeutic drug level monitoring: Secondary | ICD-10-CM

## 2022-03-05 LAB — POCT INR: INR: 1.8 — AB (ref 2.0–3.0)

## 2022-03-05 NOTE — Patient Instructions (Signed)
Description   Take 3 tablets of warfarin today.  Tomorrow take 3.5 tablets of warfarin   Then continue to take warfarin 2 tablets daily except for 3 tablets on Tuesday and Saturday.  Recheck INR in 1.5 wks.  PLEASE LET us KNOW WHEN DENTAL PROCEDURE SCHEDULED. WILL NEED BRIDGING WITH LOVENOX.  [facility] [phone].

## 2022-03-06 ENCOUNTER — Other Ambulatory Visit (HOSPITAL_COMMUNITY): Payer: Self-pay

## 2022-03-06 MED ORDER — OMEGA-3-ACID ETHYL ESTERS 1 G PO CAPS
1.0000 | ORAL_CAPSULE | Freq: Two times a day (BID) | ORAL | 1 refills | Status: DC
  Filled 2022-03-06 – 2022-03-12 (×2): qty 180, 90d supply, fill #0

## 2022-03-06 MED ORDER — ATORVASTATIN CALCIUM 80 MG PO TABS
80.0000 mg | ORAL_TABLET | Freq: Every day | ORAL | 1 refills | Status: DC
  Filled 2022-03-06: qty 90, 90d supply, fill #0

## 2022-03-07 ENCOUNTER — Other Ambulatory Visit (HOSPITAL_COMMUNITY): Payer: Self-pay

## 2022-03-09 ENCOUNTER — Other Ambulatory Visit (HOSPITAL_COMMUNITY): Payer: Self-pay

## 2022-03-12 ENCOUNTER — Other Ambulatory Visit (HOSPITAL_COMMUNITY): Payer: Self-pay

## 2022-03-12 ENCOUNTER — Telehealth: Payer: Self-pay

## 2022-03-12 NOTE — Telephone Encounter (Signed)
**Note De-Identified Jaelen Soth Obfuscation** Caisen Wiltsie Key: [id number] - PA Case ID: [id number] Outcome: Approved today Your PA request has been approved. Additional information will be provided in the approval communication. Drug: Omega-3-acid Ethyl Esters 1GM capsules Form [employer] Electronic PA Form (2017 NCPDP)  I have notified [facility] (Ph: [phone]) of this approval.

## 2022-03-13 ENCOUNTER — Other Ambulatory Visit (HOSPITAL_COMMUNITY): Payer: Self-pay

## 2022-03-20 ENCOUNTER — Other Ambulatory Visit (HOSPITAL_COMMUNITY): Payer: Self-pay

## 2022-03-27 IMAGING — CT CT ANGIO HEAD
2 of 7 series · 8 of 33 positions shown · IV contrast (APPLIED)
Comparison: Preceding head CT.

CLINICAL DATA: Dizziness. History of mitral bowel prosthesis with
noncompliant anticoagulation

EXAM:
CT ANGIOGRAPHY HEAD AND NECK
TECHNIQUE: Multidetector CT imaging of the head and neck was performed using
the standard protocol during bolus administration of intravenous
contrast. Multiplanar CT image reconstructions and MIPs were
obtained to evaluate the vascular anatomy. Carotid stenosis
measurements (when applicable) are obtained utilizing NASCET
criteria, using the distal internal carotid diameter as the
denominator.
CONTRAST:  65mL OMNIPAQUE IOHEXOL 350 MG/ML SOLN

[Series 5: cta neck/head · axial · 0.53mm/px · z∈[-229,-105]mm · 2 of 187 slices shown]
[im 63/187  soft-tissue]
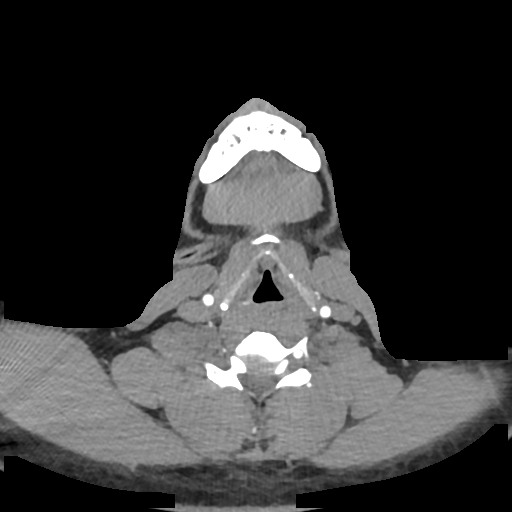
[im 125/187  soft-tissue]
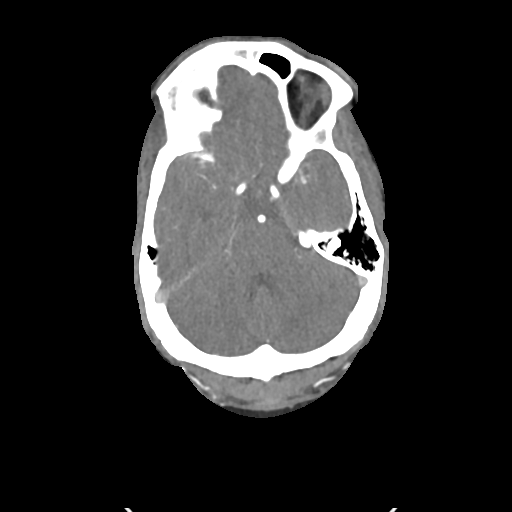

[Series 7: ax thins · axial · 0.50mm/px · z∈[-335,-68]mm · 6 of 380 slices shown]
[im 55/380  soft-tissue]
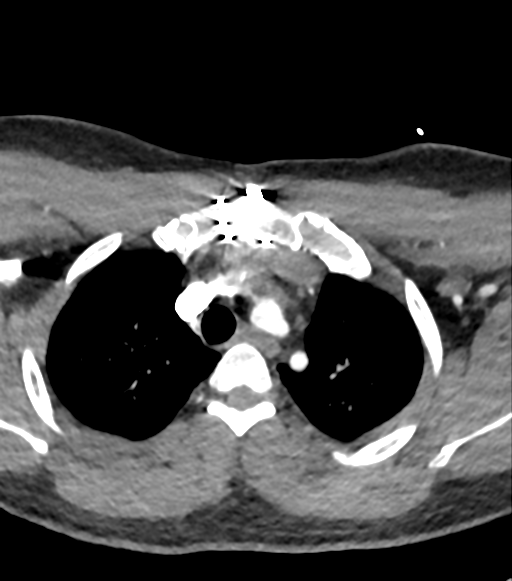
[im 109/380  bone]
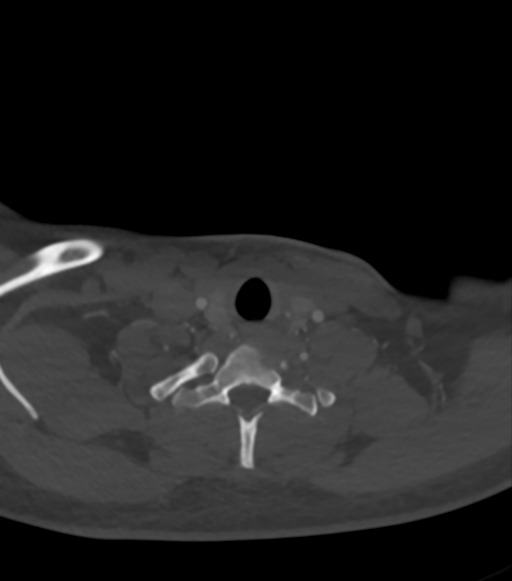
[im 163/380  soft-tissue]
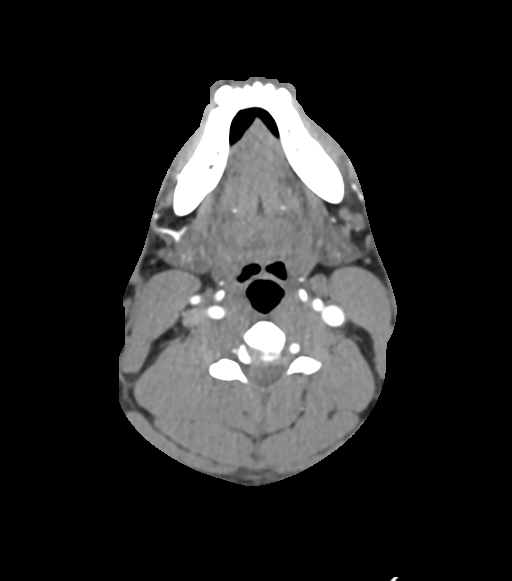
[im 217/380  bone]
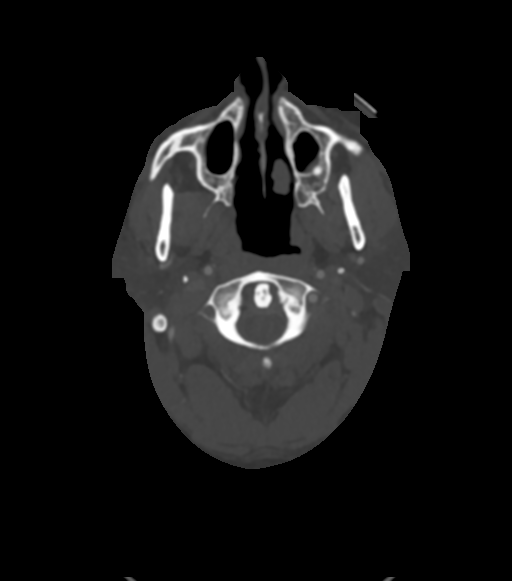
[im 271/380  soft-tissue]
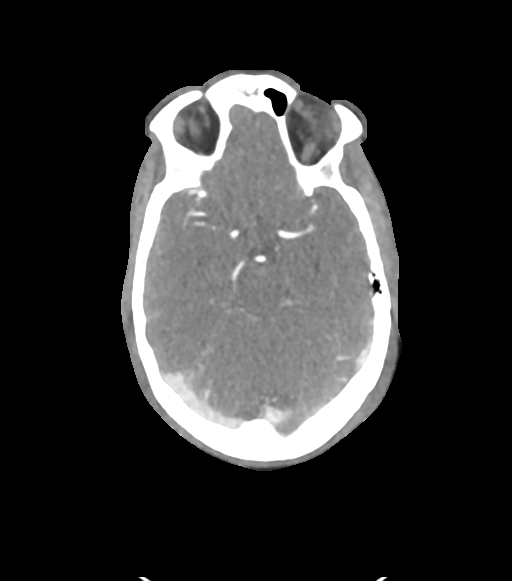
[im 325/380  bone]
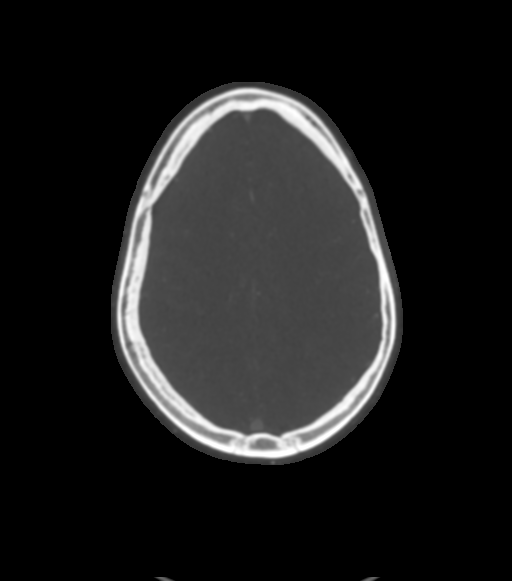

[8 of 33 positions shown; findings below may reference images not displayed]

FINDINGS: CTA NECK FINDINGS

Aortic arch: 2 vessel branching.  No acute finding.

Right carotid system: Vessels are smooth and widely patent.

Left carotid system: Vessels are smooth and widely patent.

Vertebral arteries: Strong left vertebral artery dominance. No
subclavian stenosis. No dissection, beading, or stenosis.

Skeleton: No acute or aggressive finding. Periodontal erosion
associated with the lower left second molar. There are bilateral
molar cavities.

Other neck: 19 mm left thyroid nodule which could be a cyst with
thin septation

Upper chest: No acute finding

Review of the MIP images confirms the above findings

CTA HEAD FINDINGS

Anterior circulation: Vessels are smooth and widely patent. No
aneurysm.

Posterior circulation: Strong left vertebral artery dominance. The
right vertebral artery service a tiny right PICA. The vertebral and
basilar arteries are smooth and diffusely patent. No PCA branch
occlusion or aneurysm.

Venous sinuses: Patent as permitted by contrast timing

Anatomic variants: None significant

Review of the MIP images confirms the above findings
IMPRESSION: 1. No emergent finding or arterial stenosis.
2. Dental disease which is notable in the setting of valve
replacement.

## 2022-03-27 IMAGING — DX DG CHEST 1V PORT
1 series · 1 of 1 positions shown · non-contrast
Comparison: Chest x-ray of 08/05/2018

CLINICAL DATA: Dizziness with blurred vision

EXAM:
PORTABLE CHEST 1 VIEW

[chest]
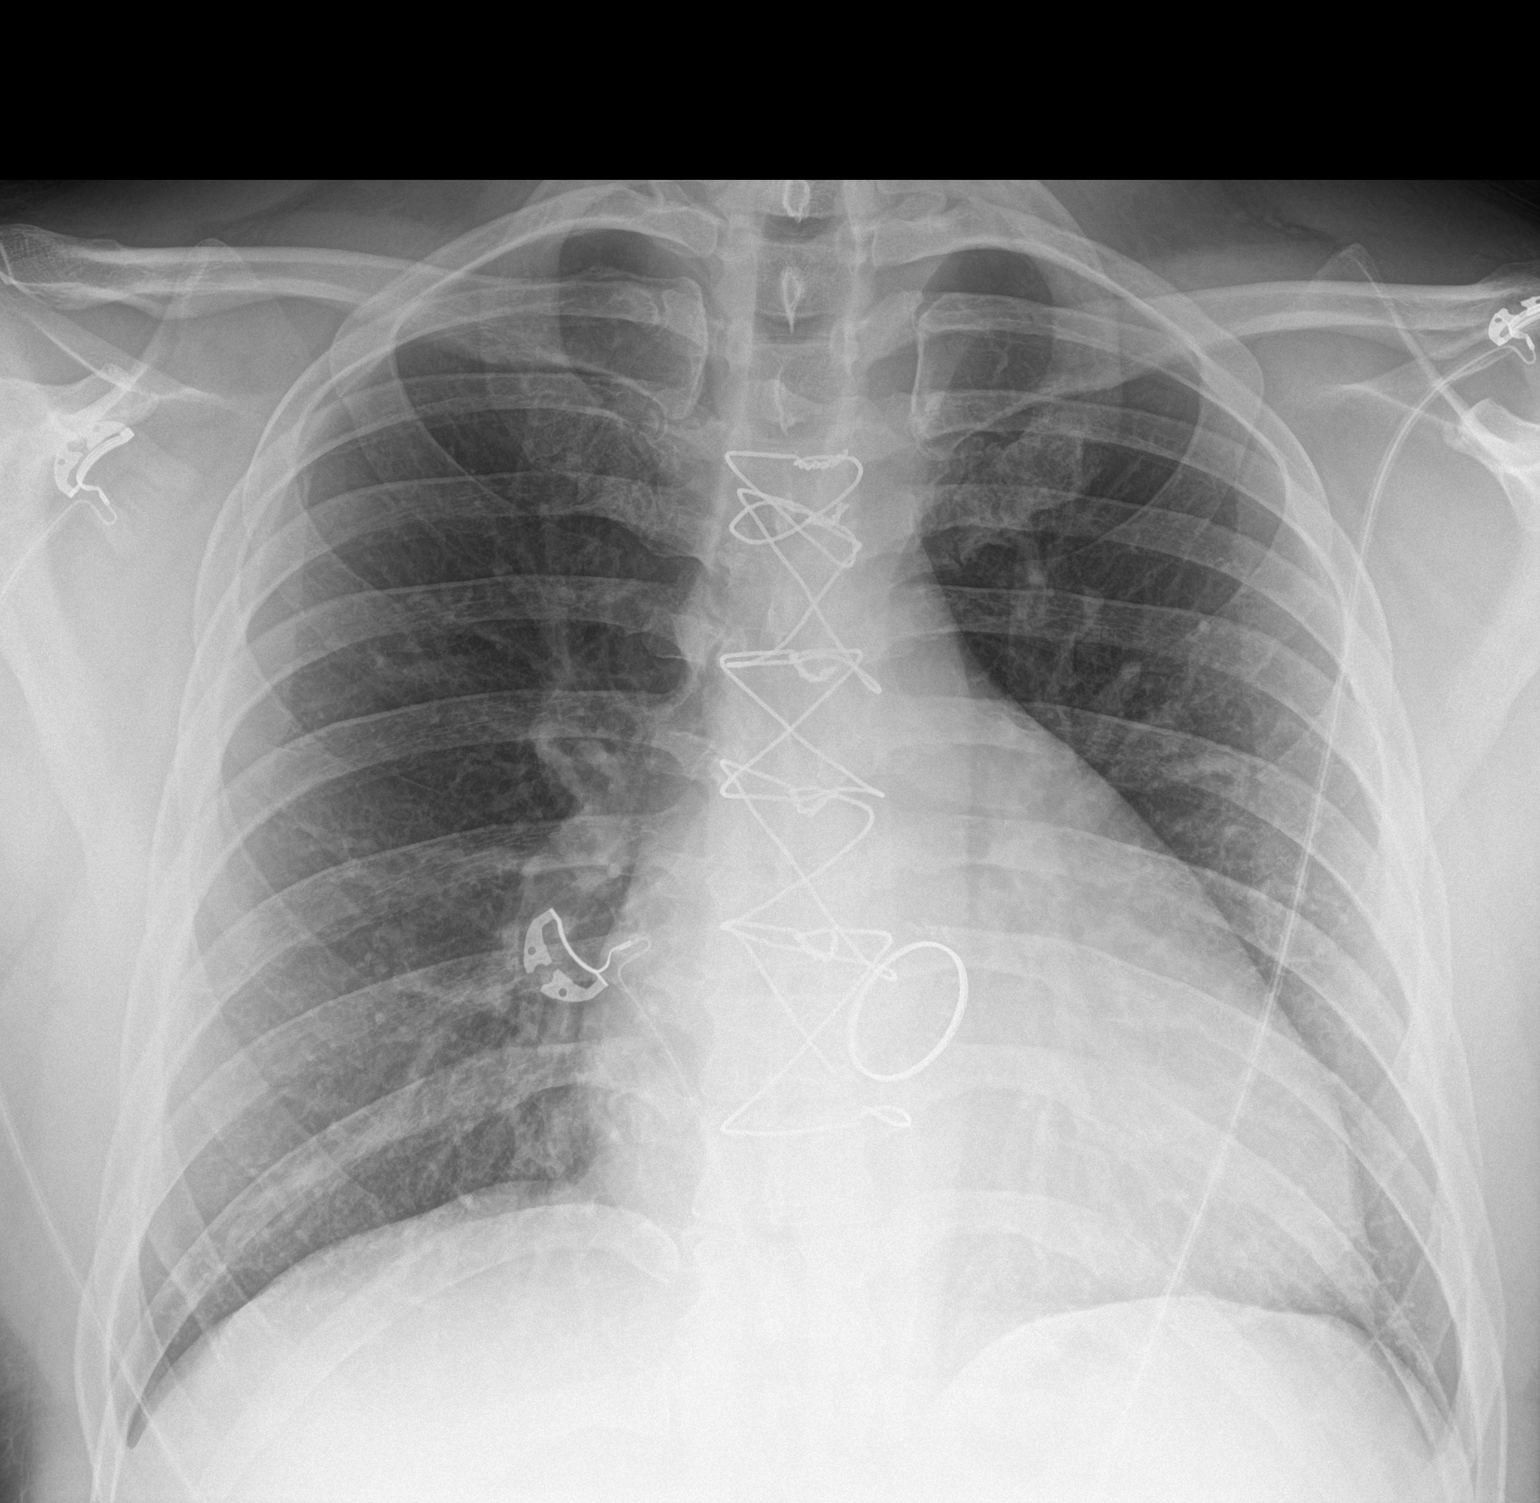

[1 of 1 positions shown; findings below may reference images not displayed]

FINDINGS: Trachea midline. Cardiomediastinal contours accentuated by portable
technique, top normal size.

Signs of mitral valve replacement as before. Hilar structures are
normal.

Lungs are clear. No sign of consolidation. No sign of pleural
effusion.

No acute skeletal process to the extent evaluated
IMPRESSION: No acute cardiopulmonary disease.

## 2022-03-27 IMAGING — CT CT HEAD CODE STROKE
4 series · 16 of 47 positions shown, 18 images · non-contrast
Comparison: 01/29/2016 brain MRI

CLINICAL DATA: Code stroke.  Dizziness

EXAM:
CT HEAD WITHOUT CONTRAST
TECHNIQUE: Contiguous axial images were obtained from the base of the skull
through the vertex without intravenous contrast.

[Series 3: head wo · axial · 0.45mm/px · z∈[-124,-4]mm · 7 of 32 slices shown, 9 images]
[im 4/32  brain]
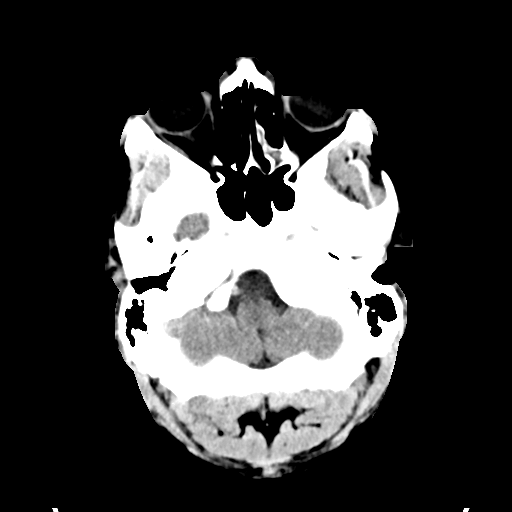
[im 4/32  bone]
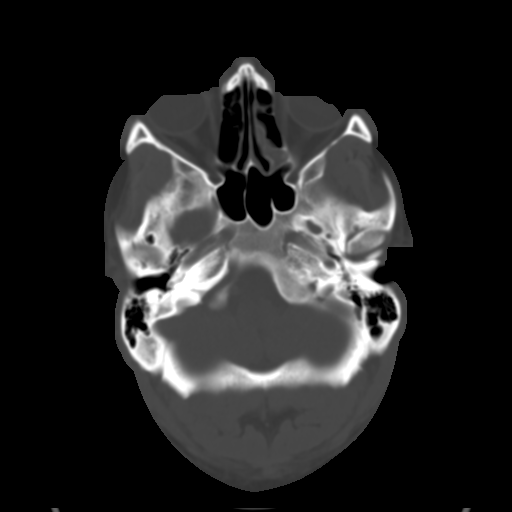
[im 8/32  brain]
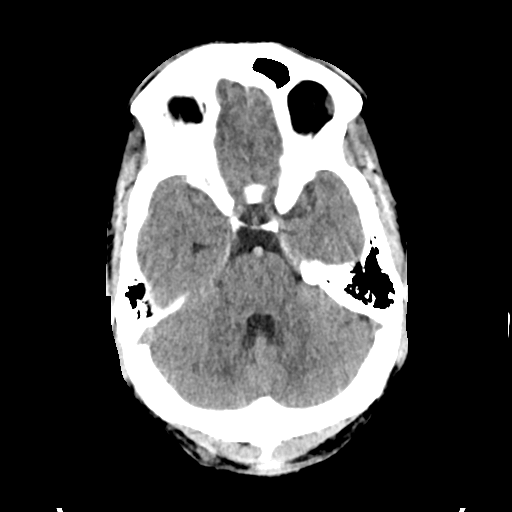
[im 12/32  brain]
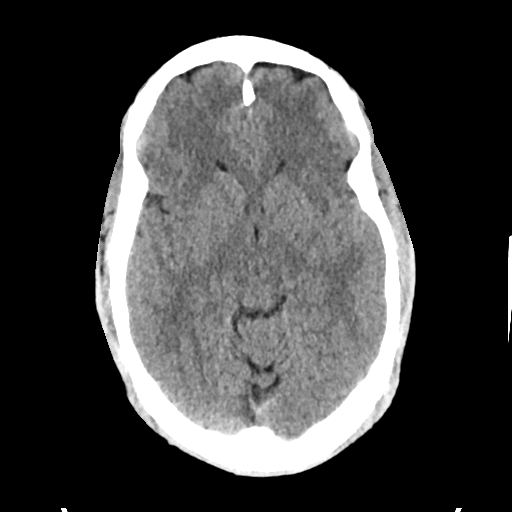
[im 16/32  brain]
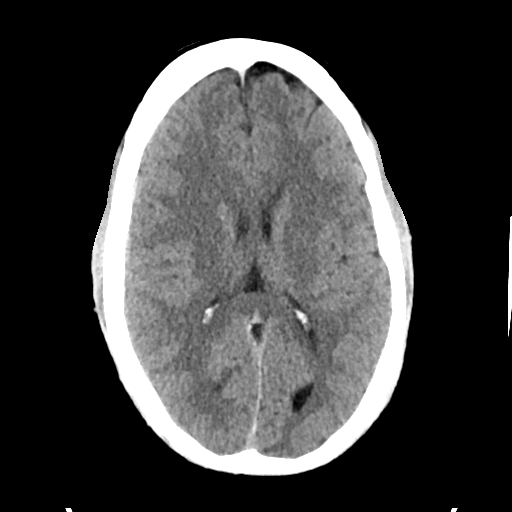
[im 20/32  brain]
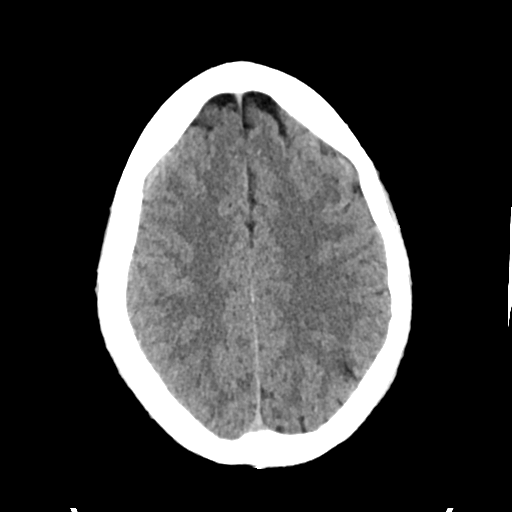
[im 20/32  bone]
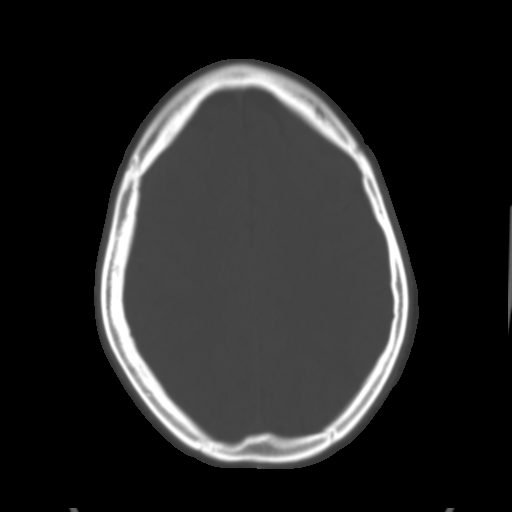
[im 24/32  brain]
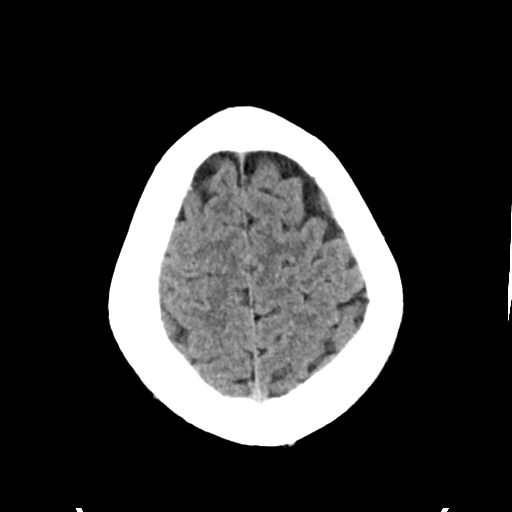
[im 28/32  brain]
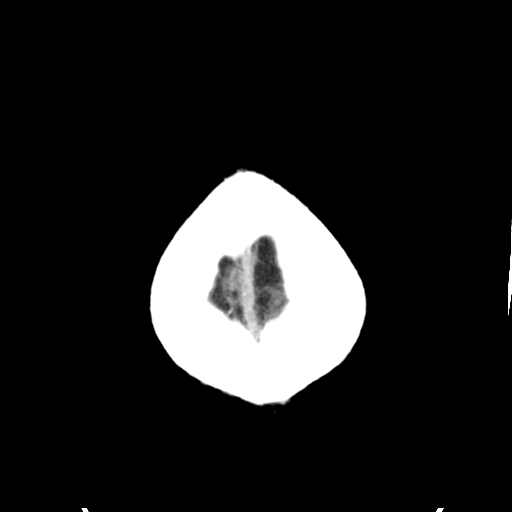

[Series 4: head bone · axial · 0.45mm/px · z∈[-125,-93]mm · 3 of 79 slices shown]
[im 8/79  bone]
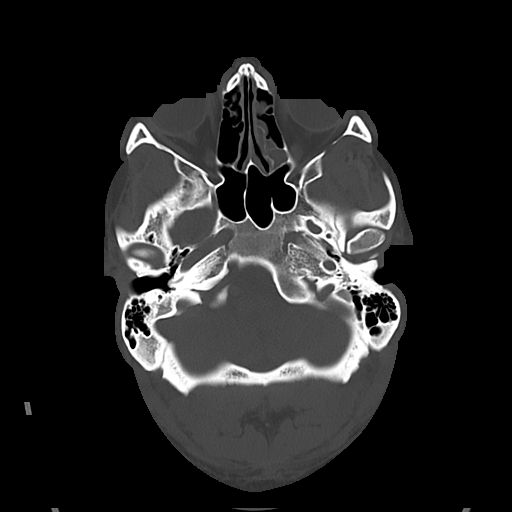
[im 16/79  bone]
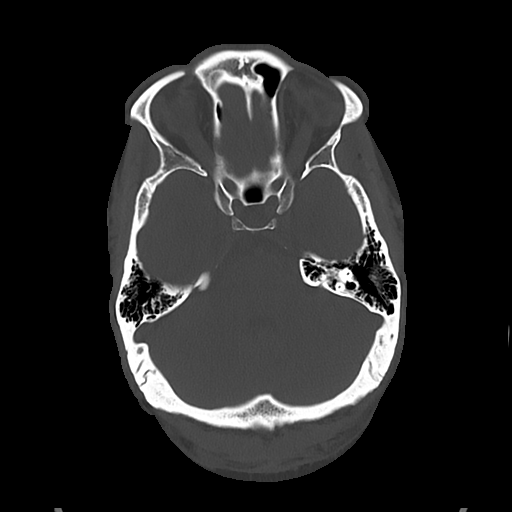
[im 24/79  bone]
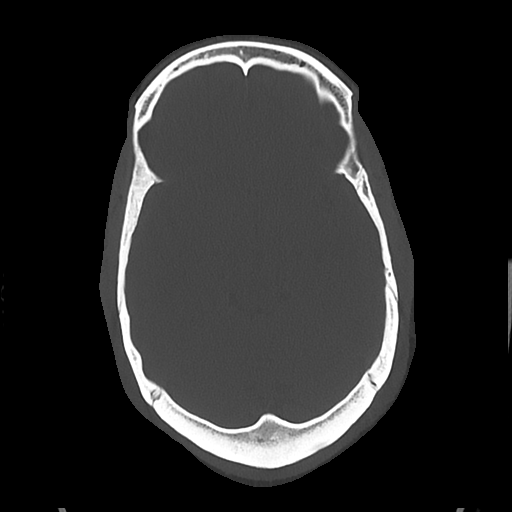

[Series 5: cor soft · coronal · 0.34mm/px · 3 of 71 slices shown]
[im 24/71  brain]
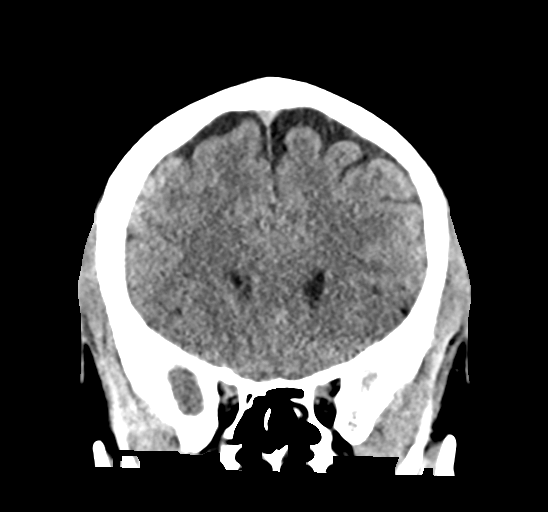
[im 32/71  brain]
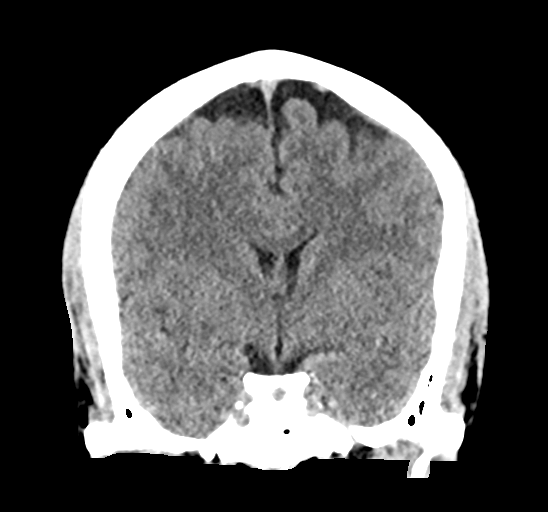
[im 39/71  brain]
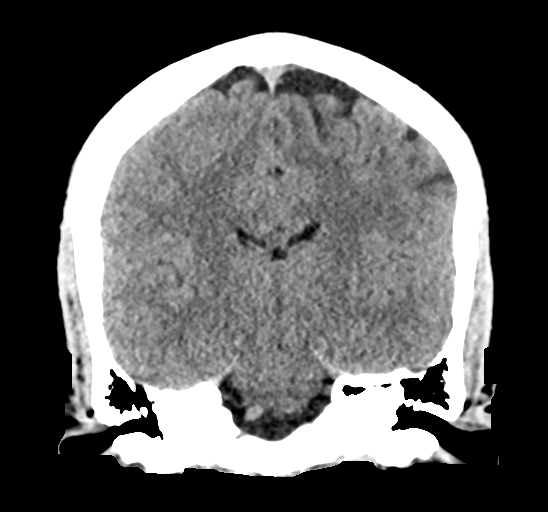

[Series 6: sag soft · sagittal · 0.36mm/px · 3 of 55 slices shown]
[im 19/55  brain]
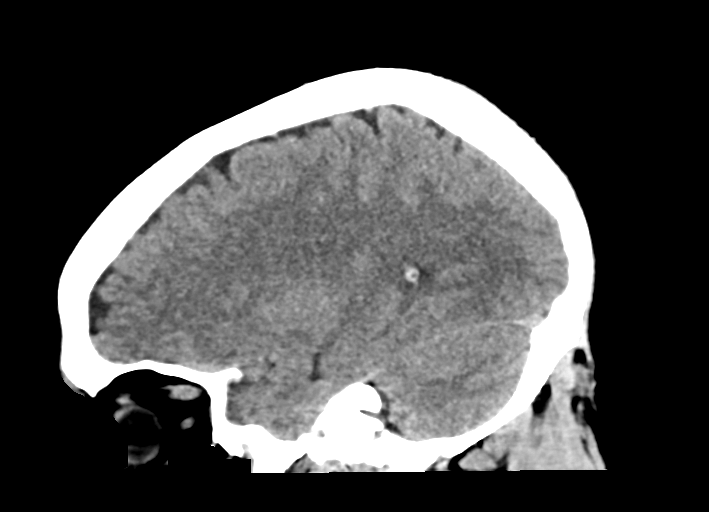
[im 28/55  brain]
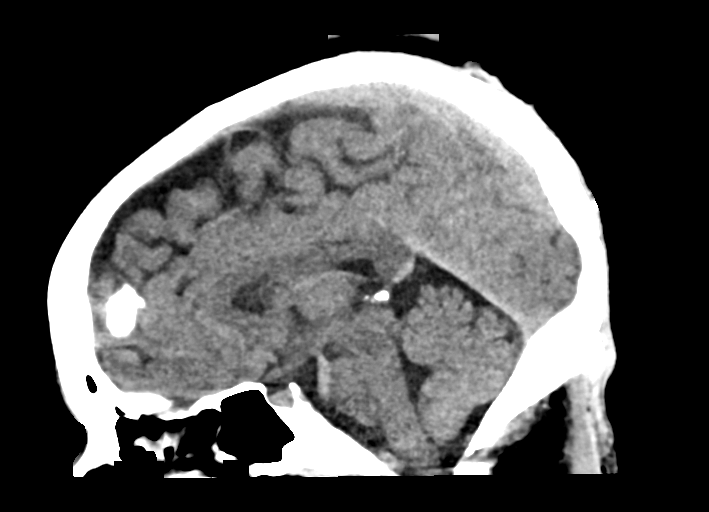
[im 37/55  brain]
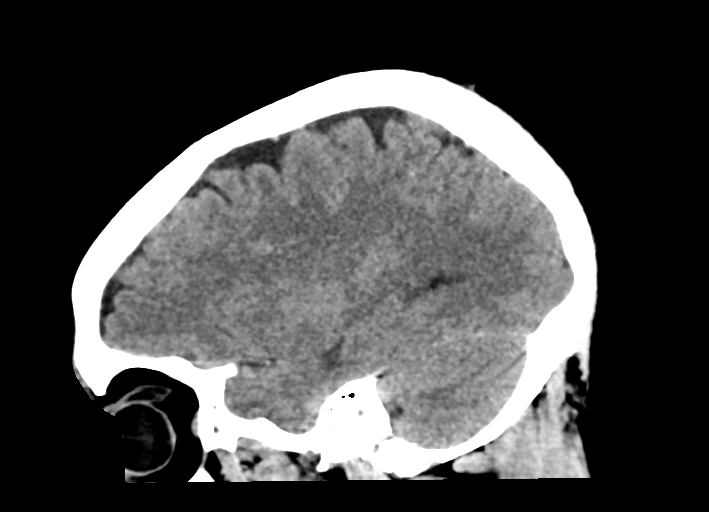

[16 of 47 positions shown; findings below may reference images not displayed]

FINDINGS: Brain: Bifrontal gray-white differentiation is poor relative to the
remainder of the brain but attributed to artifact based on
reformats. Small remote left parietal cortex infarct. No evidence of
hemorrhage, hydrocephalus, or masslike finding

Vascular: Prominent density of the basilar but not certainly
hyperdense when compared to the carotid arteries.

Skull: Negative

Sinuses/Orbits: Negative

Other: These results were communicated to Dr. Kindeya at [DATE] [place]
07/17/2020by text page via the [employer] messaging system.

ASPECTS (Al[place]ta Stroke Program Early CT Score)

Not scored with this history
IMPRESSION: 1. No acute finding.
2. Small remote left parietal infarct that is new from a 3711 brain
MRI.

## 2022-04-02 ENCOUNTER — Other Ambulatory Visit: Payer: Self-pay | Admitting: Cardiology

## 2022-04-02 DIAGNOSIS — Z5181 Encounter for therapeutic drug level monitoring: Secondary | ICD-10-CM

## 2022-04-02 DIAGNOSIS — Z952 Presence of prosthetic heart valve: Secondary | ICD-10-CM

## 2022-04-02 DIAGNOSIS — G459 Transient cerebral ischemic attack, unspecified: Secondary | ICD-10-CM

## 2022-04-03 NOTE — Telephone Encounter (Signed)
Received refill request for warfarin:  Last INR was 1.8 on 03/05/22 Was scheduled for INR check on 6/26.  Pt was a No Show LOV was 10/12/21  Latrelle Dodrill NP  Called pt.  Got voicemail.  Left message for pt to call back to schedule INR appt.  Will hold on refilling Warfarin until appt made.

## 2022-04-04 NOTE — Telephone Encounter (Addendum)
Called patient again to try to schedule INR appt. No answer.  Left message on voice mail that pt needs to call and schedule appt before refills can be given.

## 2022-04-05 NOTE — Telephone Encounter (Signed)
Called patient again to try to schedule INR appt. No answer.  Left message on voice mail that pt needs to call and schedule appt before refills can be given.  Refill denied since unable to reach patient.

## 2022-04-11 ENCOUNTER — Encounter (HOSPITAL_BASED_OUTPATIENT_CLINIC_OR_DEPARTMENT_OTHER): Payer: Self-pay

## 2022-04-11 ENCOUNTER — Other Ambulatory Visit: Payer: Self-pay

## 2022-04-11 DIAGNOSIS — Z5181 Encounter for therapeutic drug level monitoring: Secondary | ICD-10-CM

## 2022-04-11 DIAGNOSIS — Z952 Presence of prosthetic heart valve: Secondary | ICD-10-CM

## 2022-04-11 DIAGNOSIS — G459 Transient cerebral ischemic attack, unspecified: Secondary | ICD-10-CM

## 2022-04-11 MED ORDER — WARFARIN SODIUM 6 MG PO TABS: 12.0000 mg | ORAL_TABLET | Freq: Every day | ORAL | 0 refills | Status: DC

## 2022-04-11 NOTE — Telephone Encounter (Signed)
Prescription refill request received for warfarin Lov: 10/12/21 Dan Humphreys)  Next INR check: 03/19/22 Warfarin tablet strength: 6mg   Pt overdue for anticoagulation appt. Called pt to make him aware and schedule an appt. No answer, left message on voicemail.

## 2022-04-17 ENCOUNTER — Ambulatory Visit (INDEPENDENT_AMBULATORY_CARE_PROVIDER_SITE_OTHER): Payer: BLUE CROSS/BLUE SHIELD

## 2022-04-17 DIAGNOSIS — G459 Transient cerebral ischemic attack, unspecified: Secondary | ICD-10-CM

## 2022-04-17 DIAGNOSIS — Z5181 Encounter for therapeutic drug level monitoring: Secondary | ICD-10-CM | POA: Diagnosis not present

## 2022-04-17 DIAGNOSIS — Z952 Presence of prosthetic heart valve: Secondary | ICD-10-CM | POA: Diagnosis not present

## 2022-04-17 LAB — POCT INR: INR: 1.8 — AB (ref 2.0–3.0)

## 2022-04-17 MED ORDER — WARFARIN SODIUM 6 MG PO TABS: 12.0000 mg | ORAL_TABLET | Freq: Every day | ORAL | 1 refills | Status: DC

## 2022-04-17 NOTE — Patient Instructions (Signed)
TAKE 4 TABLETS TODAY and then continue 2 tablets Daily, except 3 tablets TUESDAY and SATURDAY.  INR in 2 weeks

## 2022-04-20 ENCOUNTER — Ambulatory Visit (HOSPITAL_BASED_OUTPATIENT_CLINIC_OR_DEPARTMENT_OTHER): Payer: BLUE CROSS/BLUE SHIELD | Admitting: Family

## 2022-05-01 ENCOUNTER — Ambulatory Visit (INDEPENDENT_AMBULATORY_CARE_PROVIDER_SITE_OTHER): Payer: BLUE CROSS/BLUE SHIELD

## 2022-05-01 DIAGNOSIS — Z952 Presence of prosthetic heart valve: Secondary | ICD-10-CM | POA: Diagnosis not present

## 2022-05-01 DIAGNOSIS — Z5181 Encounter for therapeutic drug level monitoring: Secondary | ICD-10-CM | POA: Diagnosis not present

## 2022-05-01 DIAGNOSIS — G459 Transient cerebral ischemic attack, unspecified: Secondary | ICD-10-CM

## 2022-05-01 LAB — POCT INR: INR: 3.7 — AB (ref 2.0–3.0)

## 2022-05-01 NOTE — Patient Instructions (Signed)
continue 2 tablets Daily, except 3 tablets TUESDAY and SATURDAY. EAT GREENS TONIGHT; INR in 5 weeks

## 2022-05-09 ENCOUNTER — Ambulatory Visit (HOSPITAL_BASED_OUTPATIENT_CLINIC_OR_DEPARTMENT_OTHER): Payer: BLUE CROSS/BLUE SHIELD | Admitting: Family

## 2022-06-05 ENCOUNTER — Ambulatory Visit: Payer: BLUE CROSS/BLUE SHIELD | Attending: Cardiology

## 2022-07-29 ENCOUNTER — Other Ambulatory Visit: Payer: Self-pay | Admitting: Family

## 2022-07-29 DIAGNOSIS — G459 Transient cerebral ischemic attack, unspecified: Secondary | ICD-10-CM

## 2022-07-29 DIAGNOSIS — Z5181 Encounter for therapeutic drug level monitoring: Secondary | ICD-10-CM

## 2022-07-29 DIAGNOSIS — Z952 Presence of prosthetic heart valve: Secondary | ICD-10-CM

## 2022-07-30 NOTE — Telephone Encounter (Signed)
Prescription refill request received for warfarin Lov: 10/12/21 Benjamin Rodriguez)  Next INR check: 06/05/22 Warfarin tablet strength: 6mg   Pt overdue for anticoagulation appt to check INR. Called pt to schedule appt, no answer. Left detailed message on voicemail stating pt needs to schedule appt for refill.

## 2022-07-30 NOTE — Telephone Encounter (Signed)
Please review for refill. Thank you! 

## 2022-09-04 ENCOUNTER — Other Ambulatory Visit (HOSPITAL_COMMUNITY): Payer: Self-pay

## 2022-09-20 NOTE — Progress Notes (Addendum)
Office Visit    Patient Name: Benjamin Rodriguez Date of Encounter: 09/21/2022  PCP:  Kathyrn Lass   [place]  Cardiologist:  Freada Bergeron, MD  Advanced Practice Provider:  No care team member to display Electrophysiologist:  None      Chief Complaint    Benjamin Rodriguez is a 36 y.o. 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  Past Medical History    Past Medical History:  Diagnosis Date   CAD (coronary artery disease)    a. 08/06/18- LHC- mechanical valve thromboembolic subtotal occlusion of distal-mid LAD with successful balloon angioplasty    History of prosthetic mitral valve 2016   Hypertension    Past Surgical History:  Procedure Laterality Date   CARDIAC SURGERY     CORONARY BALLOON ANGIOPLASTY N/A 08/06/2018   Procedure: CORONARY BALLOON ANGIOPLASTY;  Surgeon: Leonie Man, MD;  Location: [place] [facility];  Service: Cardiovascular;  Laterality: N/A;   LEFT HEART CATH AND CORONARY ANGIOGRAPHY N/A 08/06/2018   Procedure: LEFT HEART CATH AND CORONARY ANGIOGRAPHY;  Surgeon: Leonie Man, MD;  Location: [place] [facility];  Service: Cardiovascular;  Laterality: N/A;   MITRAL VALVE REPLACEMENT      Allergies  No Known Allergies  History of Present Illness    Benjamin Rodriguez is a 36 y.o. 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  Admitted with STEMI 2019 felt to be secondary to embolization from mechanical valve thrombus. Suspected thromboembolic sub total occlusion of distal mid LAD with successful balloon angioplasty restoring TIMI-3 flow. Resulted in occlusion of 1 mm distal diagonal branch. LVEF with apical anterior  hypokinesis. Coumadin was resumed as he had not been taking.   He had TIA 07/18/20 in setting of mechanical mitral valve and stopping Coumadin 2 weeks prior to event. INR was 1.0 on admit. Echo 07/17/21 LVEF 45-50%, mechanical mitral valve mean gradient 5 mm essentially unchanged from prior 08/03/20. He was started on Entresto.   He was seen 10/2020 by Estella Husk, PA noted to be taking Entresto only 3 times per week. Seen 06/2021 - he was not taking cardiac medications routinely. Entresto transitioned to Losartan due to cost.  Last seen 10/12/21 preop clearance for extraction for 4 teeth. He was taking Warfarin but none of his other cardiac medications, they were resumed. Recommended to follow up in 3 months which was not completed.   Presents today for follow up. He is still working from  home and enjoys his job. Reports no shortness of breath nor dyspnea on exertion. Reports no chest pain, pressure, or tightness. No edema, orthopnea, PND. Reports no palpitations.  He is doing light cardio with walking 1-2 mile in 30-45 minutes per day outside.He is eating at home or out and not routinely following low salt diet. He has cut back on salad. Notes he is now living in [place] with his sister, but plans to move back to [place]. He has not been taking his medications.   EKGs/Labs/Other Studies Reviewed:   The following studies were reviewed today:  Echo 07/17/20  1. Septal hypokinesis. Left ventricular ejection fraction, by estimation,  is 45 to 50%. The left ventricle has mildly decreased function. The left  ventricle demonstrates global hypokinesis. Left ventricular diastolic  parameters are indeterminate.   2. Right ventricular systolic function is normal. The right ventricular  size is normal. There is normal pulmonary artery systolic pressure.   3. Mechanical mitral valve. Mean gradient 5 mmHg, essentially unchanged  from 05/2019. Valve leaflets appear to be moving freely. . The mitral  valve  has been repaired/replaced. No evidence of mitral valve regurgitation. No  evidence of mitral stenosis. The   mean mitral valve gradient is 5.0 mmHg. There is a St. Jude mechanical  valve present in the mitral position.   4. The aortic valve is tricuspid. Aortic valve regurgitation is not  visualized. No aortic stenosis is present.   5. The inferior vena cava is normal in size with greater than 50%  respiratory variability, suggesting right atrial pressure of 3 mmHg.   EKG:  EKG is  ordered today.  The ekg ordered today demonstrates NSR 74 bpm with right superior axis deviation. No acute ST/T wave changes.   Recent Labs: No results found for requested labs within last 365 days.  Recent Lipid Panel    Component Value Date/Time   CHOL 243 (H) 07/11/2021 1119   TRIG 277 (H) 07/11/2021 1119   HDL 40 07/11/2021 1119   CHOLHDL 6.1 (H) 07/11/2021 1119   CHOLHDL 5.7 07/17/2020 1143   VLDL 23 07/17/2020 1143   LDLCALC 152 (H) 07/11/2021 1119    Home Medications   Current Meds  Medication Sig   amoxicillin (AMOXIL) 500 MG capsule Take 4 capsules (2,000 mg total) by mouth one hour before dental appointment.   aspirin EC 81 MG tablet Take 1 tablet (81 mg total) by mouth daily. Swallow whole.   enoxaparin (LOVENOX) 100 MG/ML injection Inject 1 mL (100 mg total) into the skin every 12 (twelve) hours. Until INR therapeutic   ibuprofen (ADVIL) 400 MG tablet Take 1-2 tablets (400-800 mg total) by mouth 3-4 times daily as needed for pain   prochlorperazine (COMPAZINE) 10 MG tablet Take 1 tablet (10 mg total) by mouth 2 (two) times daily as needed for nausea or other ( headache) for up to 7 days   [DISCONTINUED] metoprolol succinate (TOPROL XL) 25 MG 24 hr tablet Take 1 tablet (25 mg total) by mouth daily.     Review of Systems      All other systems reviewed and are otherwise negative except as noted above.  Physical Exam    VS:  BP (!) 142/82   Pulse 74   Ht '5\' 10"'$  (1.778 m)   Wt  211 lb 6.4 oz (95.9 kg)   SpO2 95%   BMI 30.33 kg/m  , BMI Body mass index is 30.33 kg/m.  Wt Readings from Last 3 Encounters:  09/21/22 211 lb 6.4 oz (95.9 kg)  10/12/21 239 lb (108.4 kg)  07/11/21 235 lb (106.6 kg)    GEN: Well nourished, overweight, well developed, in no acute distress. HEENT: normal. Neck: Supple, no JVD, carotid bruits, or masses. Cardiac: RRR, no murmurs, rubs, or gallops. Mechanical valve click noted. No clubbing, cyanosis, edema.  Radials/PT 2+ and equal bilaterally.  Respiratory:  Respirations regular and unlabored, clear to auscultation bilaterally. GI: Soft, nontender, nondistended. MS: No deformity or atrophy. Skin: Warm and dry, no rash. Neuro:  Strength and sensation are intact. Psych: Normal affect.  Assessment & Plan    Medication management - History of noncompliance. He  has been out of antihypertensive a few months and warfarin a few weeks. Discussed the importance of medical therapy for ICM, HFrEF, CAD.He reports medications are affordable he just needs to take them. We will work to simplify regimen for easier adherence.   ICM / HFrEF - Echo 06/2020 LVEF 45-50%. Euvolemic and well compensated on exam. No indication for loop diuretic at this time. Delene Loll previously cost prohibitive.  He has not taken medications in couple months. For simplification, will resume Losartan '50mg'$  QD but not Metoprolol at this time.   S/p mechanical MVR / Hypercoagulable state - Needs improved BP control, as below. Has not taken Warfarin in a few weeks. Discussed with pharmacy team.  CBC, CMP, PT/INR today.  Resume Warfarin '6mg'$  as directed (take 3 tablet Tuesday, Saturday and 2 tablets all other days).  Given MVR and hx TIA will need Lovenox until INR at goal. Start Lovenox '100mg'$  BID until INR therapeutic. Repeat IN[facility] at [facility] 09/26/22. Update echocardiogram to assess MV  CAD - Stable with no anginal symptoms. No indication for ischemic evaluation.  GDMT  resumed today includes Atorvastatin, Aspirin. Heart healthy diet and regular cardiovascular exercise encouraged.    History of TIA - Emphasized importance of medication adherence as well as blood pressure and cholesterol. No residual symptoms.  HTN - BP goal <130/80. Not taking Losartan, resume '50mg'$  QD. Encouraged to monitor BP at home.   Obesity - Weight loss via diet and exercise encouraged. Discussed the impact being overweight would have on cardiovascular risk. BMI 34%. Additional risk factors of HFrEF, CAD. His insurance does not cover Wegovy and as not diabetic does not qualify for Ozempic.   HLD - Resume Atorvastatin '80mg'$  QD. FLP/LFT at follow up.  Disposition: Follow up in 3 month(s) with Dr. Johney Frame or APP.  Signed, Loel Dubonnet, NP 09/21/2022, 12:57 PM [place]

## 2022-09-21 ENCOUNTER — Other Ambulatory Visit (HOSPITAL_COMMUNITY): Payer: Self-pay

## 2022-09-21 ENCOUNTER — Ambulatory Visit (HOSPITAL_BASED_OUTPATIENT_CLINIC_OR_DEPARTMENT_OTHER): Payer: BLUE CROSS/BLUE SHIELD | Admitting: Family

## 2022-09-21 ENCOUNTER — Telehealth: Payer: Self-pay | Admitting: Pharmacist

## 2022-09-21 ENCOUNTER — Encounter (HOSPITAL_BASED_OUTPATIENT_CLINIC_OR_DEPARTMENT_OTHER): Payer: Self-pay | Admitting: Family

## 2022-09-21 VITALS — BP 142/82 | HR 74 | Ht 70.0 in | Wt 211.4 lb

## 2022-09-21 DIAGNOSIS — Z5181 Encounter for therapeutic drug level monitoring: Secondary | ICD-10-CM

## 2022-09-21 DIAGNOSIS — I255 Ischemic cardiomyopathy: Secondary | ICD-10-CM

## 2022-09-21 DIAGNOSIS — Z7901 Long term (current) use of anticoagulants: Secondary | ICD-10-CM

## 2022-09-21 DIAGNOSIS — D6859 Other primary thrombophilia: Secondary | ICD-10-CM

## 2022-09-21 DIAGNOSIS — Z952 Presence of prosthetic heart valve: Secondary | ICD-10-CM

## 2022-09-21 DIAGNOSIS — G459 Transient cerebral ischemic attack, unspecified: Secondary | ICD-10-CM

## 2022-09-21 DIAGNOSIS — I25118 Atherosclerotic heart disease of native coronary artery with other forms of angina pectoris: Secondary | ICD-10-CM

## 2022-09-21 DIAGNOSIS — Z79899 Other long term (current) drug therapy: Secondary | ICD-10-CM

## 2022-09-21 DIAGNOSIS — E785 Hyperlipidemia, unspecified: Secondary | ICD-10-CM

## 2022-09-21 DIAGNOSIS — I10 Essential (primary) hypertension: Secondary | ICD-10-CM

## 2022-09-21 DIAGNOSIS — Z8673 Personal history of transient ischemic attack (TIA), and cerebral infarction without residual deficits: Secondary | ICD-10-CM

## 2022-09-21 MED ORDER — ATORVASTATIN CALCIUM 80 MG PO TABS: 80.0000 mg | ORAL_TABLET | Freq: Every day | ORAL | 3 refills | Status: AC

## 2022-09-21 MED ORDER — ENOXAPARIN SODIUM 100 MG/ML IJ SOSY: 100.0000 mg | PREFILLED_SYRINGE | Freq: Two times a day (BID) | INTRAMUSCULAR | 2 refills | Status: AC

## 2022-09-21 MED ORDER — ASPIRIN 81 MG PO TBEC: 81.0000 mg | DELAYED_RELEASE_TABLET | Freq: Every day | ORAL | 3 refills | Status: AC

## 2022-09-21 MED ORDER — WARFARIN SODIUM 6 MG PO TABS: 12.0000 mg | ORAL_TABLET | Freq: Every day | ORAL | 1 refills | Status: AC

## 2022-09-21 MED ORDER — LOSARTAN POTASSIUM 50 MG PO TABS: 50.0000 mg | ORAL_TABLET | Freq: Every day | ORAL | 3 refills | Status: AC

## 2022-09-21 NOTE — Telephone Encounter (Signed)
Rx sent. Provided to patient on AVS today prior to leaving clinic.   Alver Sorrow, NP

## 2022-09-21 NOTE — Telephone Encounter (Signed)
Patient has been off warfarin since August 2023. History of mitral valve replacement with mechanical valve and TIA.  Recommend Lovenox bridge when restarting warfarin   12/29: Inject enoxaparin 100mg  in the fatty tissue every 12 hours and take warfarin  12/30: Inject enoxaparin in the fatty tissue every 12 hours and take warfarin  12/31: Inject enoxaparin in the fatty tissue every 12 hours and take warfarin  1/1: Inject enoxaparin in the fatty tissue every 12 hours and take warfarin  1/2: Inject enoxaparin in the fatty tissue every 12 hours and take warfarin  1/3: Check INR. If not over 2.5, continue Lovenox every 12 hours with warfarin.   Continue checking INR daily until greater than 2.5. Can discontinue enoxaparin once INR reaches goal

## 2022-09-21 NOTE — Patient Instructions (Addendum)
Medication Instructions:  Your physician has recommended you make the following change in your medication:   RESUME Losartan 50mg  daily  RESUME Atorvastatin 80mg  daily  START Aspirin EC 81mg  daily  RESUME Warfarin (Coumadin)  Sun  Mon Tue Wed Thu Fri Sat     2 tablets 2 tablets   3 tablets   2 tablets   2 tablets   2 tablets   3 tablets      Will need Lovenox until INR at goal.  12/29: Inject enoxaparin 100mg  in the fatty tissue every 12 hours and take warfarin  12/30: Inject enoxaparin in the fatty tissue every 12 hours and take warfarin  12/31: Inject enoxaparin in the fatty tissue every 12 hours and take warfarin  1/1: Inject enoxaparin in the fatty tissue every 12 hours and take warfarin  1/2: Inject enoxaparin in the fatty tissue every 12 hours and take warfarin  1/3: Check INR. If not over 2.5, continue Lovenox every 12 hours with warfarin.   Continue checking INR daily until greater than 2.5. Can discontinue enoxaparin once INR reaches goal  Remain off: Clopidogrel (Plavix), Lovaza, Metoprolol Succinate (Toprol)   *If you need a refill on your cardiac medications before your next appointment, please call your pharmacy*   Lab Work: Your physician recommends that you return for lab work today: CBC, CMP, PT/INR  Your physician recommends that you return for lab work on January 3rd for PT/INR. May do at [facility]   If you have labs (blood work) drawn today and your tests are completely normal, you will receive your results only by: MyChart Message (if you have MyChart) OR A paper copy in the mail If you have any lab test that is abnormal or we need to change your treatment, we will call you to review the results.   Testing/Procedures: Your physician has requested that you have an echocardiogram. Echocardiography is a painless test that uses sound waves to create images of your heart. It provides your doctor with information about the size and shape of  your heart and how well your heart's chambers and valves are working. This procedure takes approximately one hour. There are no restrictions for this procedure. Please do NOT wear cologne, perfume, aftershave, or lotions (deodorant is allowed). Please arrive 15 minutes prior to your appointment time.    Follow-Up: At [facility], you and your health needs are our priority.  As part of our continuing mission to provide you with exceptional heart care, we have created designated Provider Care Teams.  These Care Teams include your primary Cardiologist (physician) and Advanced Practice Providers (APPs -  Physician Assistants and Nurse Practitioners) who all work together to provide you with the care you need, when you need it.  We recommend signing up for the patient portal called "MyChart".  Sign up information is provided on this After Visit Summary.  MyChart is used to connect with patients for Virtual Visits (Telemedicine).  Patients are able to view lab/test results, encounter notes, upcoming appointments, etc.  Non-urgent messages can be sent to your provider as well.   To learn more about what you can do with MyChart, go to 1/32.    Your next appointment:   3 month(s)  The format for your next appointment:   In Person  Provider:   [birth year]1985, MD  or [facility], NP    Other Instructions   Tips to Measure your Blood Pressure Correctly  Here's what you can  do to ensure a correct reading:  Don't drink a caffeinated beverage or smoke during the 30 minutes before the test.  Sit quietly for five minutes before the test begins.  During the measurement, sit in a chair with your feet on the floor and your arm supported so your elbow is at about heart level.  The inflatable part of the cuff should completely cover at least 80% of your upper arm, and the cuff should be placed on bare skin, not over a shirt.  Don't talk during the measurement.  Blood  pressure categories  Blood pressure category SYSTOLIC (upper number)  DIASTOLIC (lower number)  Normal Less than 120 mm Hg and Less than 80 mm Hg  Elevated 120-129 mm Hg and Less than 80 mm Hg  High blood pressure: Stage 1 hypertension 130-139 mm Hg or 80-89 mm Hg  High blood pressure: Stage 2 hypertension 140 mm Hg or higher or 90 mm Hg or higher  Hypertensive crisis (consult your doctor immediately) Higher than 180 mm Hg and/or Higher than 120 mm Hg  Source: American Heart Association and American Stroke Association. For more on getting your blood pressure under control, buy Controlling Your Blood Pressure, a Special Health Report from [facility].   Blood Pressure Log   Date   Time  Blood Pressure  Example: Nov 1 9 AM 124/78

## 2022-09-22 LAB — CBC
Hematocrit: 45.9 % (ref 37.5–51.0)
Hemoglobin: 15.8 g/dL (ref 13.0–17.7)
MCH: 30.2 pg (ref 26.6–33.0)
MCHC: 34.4 g/dL (ref 31.5–35.7)
MCV: 88 fL (ref 79–97)
Platelets: 230 10*3/uL (ref 150–450)
RBC: 5.24 x10E6/uL (ref 4.14–5.80)
RDW: 12.8 % (ref 11.6–15.4)
WBC: 5 10*3/uL (ref 3.4–10.8)

## 2022-09-22 LAB — COMPREHENSIVE METABOLIC PANEL
ALT: 29 IU/L (ref 0–44)
AST: 25 IU/L (ref 0–40)
Albumin/Globulin Ratio: 1.7 (ref 1.2–2.2)
Albumin: 4.8 g/dL (ref 4.1–5.1)
Alkaline Phosphatase: 73 IU/L (ref 44–121)
BUN/Creatinine Ratio: 8 — ABNORMAL LOW (ref 9–20)
BUN: 8 mg/dL (ref 6–20)
Bilirubin Total: 0.7 mg/dL (ref 0.0–1.2)
CO2: 24 mmol/L (ref 20–29)
Calcium: 9.9 mg/dL (ref 8.7–10.2)
Chloride: 103 mmol/L (ref 96–106)
Creatinine, Ser: 1.03 mg/dL (ref 0.76–1.27)
Globulin, Total: 2.8 g/dL (ref 1.5–4.5)
Glucose: 81 mg/dL (ref 70–99)
Potassium: 4.6 mmol/L (ref 3.5–5.2)
Sodium: 142 mmol/L (ref 134–144)
Total Protein: 7.6 g/dL (ref 6.0–8.5)
eGFR: 96 mL/min/{1.73_m2} (ref >59)

## 2022-09-22 LAB — PROTIME-INR
INR: 1 (ref 0.9–1.2)
Prothrombin Time: 10.9 s (ref 9.1–12.0)

## 2022-09-26 ENCOUNTER — Encounter (HOSPITAL_BASED_OUTPATIENT_CLINIC_OR_DEPARTMENT_OTHER): Payer: Self-pay

## 2022-10-15 ENCOUNTER — Other Ambulatory Visit (HOSPITAL_BASED_OUTPATIENT_CLINIC_OR_DEPARTMENT_OTHER): Payer: BLUE CROSS/BLUE SHIELD

## 2022-10-16 ENCOUNTER — Telehealth (HOSPITAL_BASED_OUTPATIENT_CLINIC_OR_DEPARTMENT_OTHER): Payer: Self-pay | Admitting: Family

## 2022-10-16 NOTE — Telephone Encounter (Signed)
Left message for patient to call and discuss rescheduling the 10/15/22 Echocardiogram  appointment ordered by Laurann Montana, NP

## 2022-10-18 NOTE — Telephone Encounter (Signed)
Left message for patient to call and discuss rescheduling the Echocardiogram ordered by Laurann Montana, NP

## 2022-10-26 NOTE — Telephone Encounter (Signed)
Left message for patient to call and discuss rescheduling the Echocardiogram ordered by Laurann Montana, NP

## 2022-10-31 ENCOUNTER — Encounter (HOSPITAL_BASED_OUTPATIENT_CLINIC_OR_DEPARTMENT_OTHER): Payer: Self-pay | Admitting: Family

## 2022-11-15 NOTE — Progress Notes (Deleted)
Cardiology Office Note:    Date:  11/15/2022   ID:  Benjamin Rodriguez, DOB [birth year], MRN [id number]  PCP:  Kathyrn Lass   [facility] Providers Cardiologist:  Freada Bergeron, MD { Click to update primary MD,subspecialty MD or APP then REFRESH:1}    Referring MD: No ref. provider found   No chief complaint on file. ***  History of Present Illness:    Benjamin Rodriguez is a 37 y.o. male with a hx of ***  Past Medical History:  Diagnosis Date   CAD (coronary artery disease)    a. 08/06/18- LHC- mechanical valve thromboembolic subtotal occlusion of distal-mid LAD with successful balloon angioplasty    History of prosthetic mitral valve 2016   Hypertension     Past Surgical History:  Procedure Laterality Date   CARDIAC SURGERY     CORONARY BALLOON ANGIOPLASTY N/A 08/06/2018   Procedure: CORONARY BALLOON ANGIOPLASTY;  Surgeon: Benjamin Man, MD;  Location: [facility];  Service: Cardiovascular;  Laterality: N/A;   LEFT HEART CATH AND CORONARY ANGIOGRAPHY N/A 08/06/2018   Procedure: LEFT HEART CATH AND CORONARY ANGIOGRAPHY;  Surgeon: Benjamin Man, MD;  Location: [facility];  Service: Cardiovascular;  Laterality: N/A;   MITRAL VALVE REPLACEMENT      Current Medications: No outpatient medications have been marked as taking for the 11/26/22 encounter (Appointment) with Freada Bergeron, MD.     Allergies:   Patient has no known allergies.   Social History   Socioeconomic History   Marital status: Single    Spouse name: Not on file   Number of children: Not on file   Years of education: Not on file   Highest education level: Not on file  Occupational History   Occupation: works at [employer] in the prior [place] Use   Smoking status: Never   Smokeless tobacco: Never  Vaping Use   Vaping Use: Never used  Substance and Sexual Activity   Alcohol use: Yes   Drug use: Never   Sexual activity: Not on file  Other Topics Concern    Not on file  Social History Narrative   Not on file   Social Determinants of Health   Financial Resource Strain: Not on file  Food Insecurity: Not on file  Transportation Needs: Not on file  Physical Activity: Not on file  Stress: Not on file  Social Connections: Not on file     Family History: The patient's ***family history includes Aneurysm in his maternal grandmother; Breast cancer in his maternal grandmother; Heart attack in his father; Hypertension in his father. There is no history of Clotting disorder or Anesthesia problems.  ROS:   Please see the history of present illness.    *** All other systems reviewed and are negative.  EKGs/Labs/Other Studies Reviewed:    The following studies were reviewed today: ***  EKG:  EKG is *** ordered today.  The ekg ordered today demonstrates ***  Recent Labs: 09/21/2022: ALT 29; BUN 8; Creatinine, Ser 1.03; Hemoglobin 15.8; Platelets 230; Potassium 4.6; Sodium 142  Recent Lipid Panel    Component Value Date/Time   CHOL 243 (H) 07/11/2021 1119   TRIG 277 (H) 07/11/2021 1119   HDL 40 07/11/2021 1119   CHOLHDL 6.1 (H) 07/11/2021 1119   CHOLHDL 5.7 07/17/2020 1143   VLDL 23 07/17/2020 1143   LDLCALC 152 (H) 07/11/2021 1119     Risk Assessment/Calculations:   {Does this patient have ATRIAL FIBRILLATION?:[id number]}  No BP recorded.  {Refresh Note OR Click here to enter BP  :1}***         Physical Exam:    VS:  There were no vitals taken for this visit.    Wt Readings from Last 3 Encounters:  09/21/22 211 lb 6.4 oz (95.9 kg)  10/12/21 239 lb (108.4 kg)  07/11/21 235 lb (106.6 kg)     GEN: *** Well nourished, well developed in no acute distress HEENT: Normal NECK: No JVD; No carotid bruits LYMPHATICS: No lymphadenopathy CARDIAC: ***RRR, no murmurs, rubs, gallops RESPIRATORY:  Clear to auscultation without rales, wheezing or rhonchi  ABDOMEN: Soft, non-tender, non-distended MUSCULOSKELETAL:  No edema; No  deformity  SKIN: Warm and dry NEUROLOGIC:  Alert and oriented x 3 PSYCHIATRIC:  Normal affect   ASSESSMENT:    No diagnosis found. PLAN:    In order of problems listed above:  ***      {Are you ordering a CV Procedure (e.g. stress test, cath, DCCV, TEE, etc)?   Press F2        :[id number]    Medication Adjustments/Labs and Tests Ordered: Current medicines are reviewed at length with the patient today.  Concerns regarding medicines are outlined above.  No orders of the defined types were placed in this encounter.  No orders of the defined types were placed in this encounter.   There are no Patient Instructions on file for this visit.   Signed, Freada Bergeron, MD  11/15/2022 1:45 PM    [facility] Guerra

## 2022-11-21 ENCOUNTER — Ambulatory Visit (INDEPENDENT_AMBULATORY_CARE_PROVIDER_SITE_OTHER): Payer: BLUE CROSS/BLUE SHIELD

## 2022-11-21 DIAGNOSIS — Z952 Presence of prosthetic heart valve: Secondary | ICD-10-CM | POA: Diagnosis not present

## 2022-11-21 LAB — ECHOCARDIOGRAM COMPLETE
Area-P 1/2: 2.95 cm2
MV VTI: 0.94 cm2
S' Lateral: 3.67 cm

## 2022-11-26 ENCOUNTER — Ambulatory Visit: Payer: BLUE CROSS/BLUE SHIELD | Admitting: Cardiology

## 2022-11-26 ENCOUNTER — Ambulatory Visit (HOSPITAL_BASED_OUTPATIENT_CLINIC_OR_DEPARTMENT_OTHER): Payer: BLUE CROSS/BLUE SHIELD | Admitting: Family

## 2022-11-26 ENCOUNTER — Ambulatory Visit: Payer: BLUE CROSS/BLUE SHIELD | Admitting: Cardiovascular Disease

## 2022-11-26 NOTE — Progress Notes (Deleted)
Office Visit    Patient Name: Benjamin Rodriguez Date of Encounter: 11/26/2022  PCP:  Kathyrn Lass   [place]  Cardiologist:  Freada Bergeron, MD  Advanced Practice Provider:  No care team member to display Electrophysiologist:  None      Chief Complaint    Benjamin Rodriguez is a 37 y.o. 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  Past Medical History    Past Medical History:  Diagnosis Date   CAD (coronary artery disease)    a. 08/06/18- LHC- mechanical valve thromboembolic subtotal occlusion of distal-mid LAD with successful balloon angioplasty    History of prosthetic mitral valve 2016   Hypertension    Past Surgical History:  Procedure Laterality Date   CARDIAC SURGERY     CORONARY BALLOON ANGIOPLASTY N/A 08/06/2018   Procedure: CORONARY BALLOON ANGIOPLASTY;  Surgeon: Leonie Man, MD;  Location: [facility];  Service: Cardiovascular;  Laterality: N/A;   LEFT HEART CATH AND CORONARY ANGIOGRAPHY N/A 08/06/2018   Procedure: LEFT HEART CATH AND CORONARY ANGIOGRAPHY;  Surgeon: Leonie Man, MD;  Location: [facility];  Service: Cardiovascular;  Laterality: N/A;   MITRAL VALVE REPLACEMENT      Allergies  No Known Allergies  History of Present Illness    Benjamin Rodriguez is a 37 y.o. male with a hx of MR s/p St. Jude MVR at [facility] 2016, TIA, HFrEF, CAD s/p NSTEMI thromboembolic with subtotal occlusion of distal to mid LAD with balloon angioplasty, HTN, HLD last seen 10/12/21.  Admitted with STEMI 2019 felt to be secondary to embolization from mechanical valve thrombus. Suspected thromboembolic sub total occlusion of distal mid LAD with successful balloon angioplasty restoring TIMI-3 flow. Resulted in occlusion of 1 mm distal diagonal branch. LVEF with apical anterior  hypokinesis. Coumadin was resumed as he had not been taking.   He had TIA 07/18/20 in setting of mechanical mitral valve and stopping Coumadin 2 weeks prior to event. INR was 1.0 on admit. Echo 07/17/21 LVEF 45-50%, mechanical mitral valve mean gradient 5 mm essentially unchanged from prior 08/03/20. He was started on Entresto.   He was seen 10/2020 by Estella Husk, PA noted to be taking Entresto only 3 times per week. Seen 06/2021 - he was not taking cardiac medications routinely. Entresto transitioned to Losartan due to cost.  Last seen 10/12/21 preop clearance for extraction for 4 teeth. He was taking Warfarin but none of his other cardiac medications, they were resumed. Recommended to follow up in 3 months which was not completed.   Presents today for follow up. He is still working from  home and enjoys his job. Reports no shortness of breath nor dyspnea on exertion. Reports no chest pain, pressure, or tightness. No edema, orthopnea, PND. Reports no palpitations.  He is doing light cardio with walking 1-2 mile in 30-45 minutes per day outside.He is eating at home or out and not routinely following low salt diet. He has cut back on salad. Notes he is now living in [place] with his sister, but plans to move back to [place]. He has not been taking his medications.   EKGs/Labs/Other Studies Reviewed:   The following studies were reviewed today:  Echo 11/21/22  1. Left ventricular ejection fraction, by estimation, is 40  to 45%. The  left ventricle has mildly decreased function. The left ventricle has no  regional wall motion abnormalities. Left ventricular diastolic parameters  are consistent with Grade I  diastolic dysfunction (impaired relaxation).   2. Right ventricular systolic function is normal. The right ventricular  size is normal.   3. Left atrial size was moderately dilated.   4. The mitral valve has been repaired/replaced. No evidence of mitral  valve regurgitation. No evidence of  mitral stenosis. The mean mitral valve  gradient is 5.0 mmHg. There is a St. Jude mechanical valve present in the  mitral position. Procedure Date:  2016. Echo findings are consistent with normal structure and function of  the mitral valve prosthesis.   5. The aortic valve is normal in structure. Aortic valve regurgitation is  not visualized. No aortic stenosis is present.   6. The inferior vena cava is normal in size with greater than 50%  respiratory variability, suggesting right atrial pressure of 3 mmHg.  Echo 07/17/20  1. Septal hypokinesis. Left ventricular ejection fraction, by estimation,  is 45 to 50%. The left ventricle has mildly decreased function. The left  ventricle demonstrates global hypokinesis. Left ventricular diastolic  parameters are indeterminate.   2. Right ventricular systolic function is normal. The right ventricular  size is normal. There is normal pulmonary artery systolic pressure.   3. Mechanical mitral valve. Mean gradient 5 mmHg, essentially unchanged  from 05/2019. Valve leaflets appear to be moving freely. . The mitral valve  has been repaired/replaced. No evidence of mitral valve regurgitation. No  evidence of mitral stenosis. The   mean mitral valve gradient is 5.0 mmHg. There is a St. Jude mechanical  valve present in the mitral position.   4. The aortic valve is tricuspid. Aortic valve regurgitation is not  visualized. No aortic stenosis is present.   5. The inferior vena cava is normal in size with greater than 50%  respiratory variability, suggesting right atrial pressure of 3 mmHg.   EKG:  EKG is not ordered today.    Recent Labs: 09/21/2022: ALT 29; BUN 8; Creatinine, Ser 1.03; Hemoglobin 15.8; Platelets 230; Potassium 4.6; Sodium 142  Recent Lipid Panel    Component Value Date/Time   CHOL 243 (H) 07/11/2021 1119   TRIG 277 (H) 07/11/2021 1119   HDL 40 07/11/2021 1119   CHOLHDL 6.1 (H) 07/11/2021 1119   CHOLHDL 5.7 07/17/2020 1143   VLDL  23 07/17/2020 1143   LDLCALC 152 (H) 07/11/2021 1119    Home Medications   No outpatient medications have been marked as taking for the 11/26/22 encounter (Appointment) with Loel Dubonnet, NP.     Review of Systems      All other systems reviewed and are otherwise negative except as noted above.  Physical Exam    VS:  There were no vitals taken for this visit. , BMI There is no height or weight on file to calculate BMI.  Wt Readings from Last 3 Encounters:  09/21/22 211 lb 6.4 oz (95.9 kg)  10/12/21 239 lb (108.4 kg)  07/11/21 235 lb (106.6 kg)    GEN: Well nourished, overweight, well developed, in no acute distress. HEENT: normal. Neck: Supple, no JVD, carotid bruits, or masses. Cardiac: RRR, no murmurs, rubs, or gallops. Mechanical valve click noted. No clubbing, cyanosis, edema.  Radials/PT 2+ and equal bilaterally.  Respiratory:  Respirations regular and unlabored, clear to auscultation bilaterally. GI: Soft, nontender, nondistended. MS: No deformity or atrophy. Skin: Warm  and dry, no rash. Neuro:  Strength and sensation are intact. Psych: Normal affect.  Assessment & Plan    Medication management - History of noncompliance. He has been out of antihypertensive a few months and warfarin a few weeks. Discussed the importance of medical therapy for ICM, HFrEF, CAD.He reports medications are affordable he just needs to take them. We will work to simplify regimen for easier adherence. ***  ICM / HFrEF - Echo 06/2020 LVEF 45-50%. Euvolemic and well compensated on exam. No indication for loop diuretic at this time. Delene Loll previously cost prohibitive.  He has not taken medications in couple months. For simplification, will resume Losartan '50mg'$  QD but not Metoprolol at this time. ***  S/p mechanical MVR / Hypercoagulable state - Needs improved BP control, as below. Has not taken Warfarin in a few weeks. Discussed with pharmacy team.  CBC, CMP, PT/INR today.  Resume Warfarin  '6mg'$  as directed (take 3 tablet Tuesday, Saturday and 2 tablets all other days).  Given MVR and hx TIA will need Lovenox until INR at goal. Start Lovenox '100mg'$  BID until INR therapeutic. Repeat IN[facility] at [facility] 09/26/22. Update echocardiogram to assess MV***  CAD - Stable with no anginal symptoms. No indication for ischemic evaluation.  GDMT resumed today includes Atorvastatin, Aspirin. Heart healthy diet and regular cardiovascular exercise encouraged.  ***  History of TIA - Emphasized importance of medication adherence as well as blood pressure and cholesterol. No residual symptoms.***  HTN - BP goal <130/80. Not taking Losartan, resume '50mg'$  QD. Encouraged to monitor BP at home. ***  Obesity - Weight loss via diet and exercise encouraged. Discussed the impact being overweight would have on cardiovascular risk. BMI 34%. Additional risk factors of HFrEF, CAD. His insurance does not cover Wegovy and as not diabetic does not qualify for Ozempic. ***  HLD - Resume Atorvastatin '80mg'$  QD. FLP/LFT at follow up.***  Disposition: Follow up in 3 month(s) ***with Dr. Johney Frame or APP.  Signed, Loel Dubonnet, NP 11/26/2022, 1:22 PM [facility]

## 2023-02-05 ENCOUNTER — Telehealth: Payer: Self-pay | Admitting: *Deleted

## 2023-02-05 NOTE — Telephone Encounter (Signed)
Called pt since he is overdue; there was no answer. Left a message for the pt to call back to reschedule.
